# Patient Record
Sex: Male | Born: 1990 | Hispanic: Yes | Marital: Single | Smoking: Never smoker
Health system: Southern US, Community
[De-identification: ages and names within clinical notes are randomized; demographics above are authoritative.]

## PROBLEM LIST (undated history)

## (undated) DIAGNOSIS — F419 Anxiety disorder, unspecified: Secondary | ICD-10-CM

---

## 2016-06-18 ENCOUNTER — Inpatient Hospital Stay
Admission: RE | Admit: 2016-06-18 | Discharge: 2016-06-23 | DRG: 885 | Disposition: A | Discharge status: Home / Self Care | Payer: No Typology Code available for payment source | Source: Other Acute Inpatient Hospital | Attending: Psychiatry | Admitting: Psychiatry

## 2016-06-18 DIAGNOSIS — E669 Obesity, unspecified: Secondary | ICD-10-CM | POA: Diagnosis present

## 2016-06-18 DIAGNOSIS — F209 Schizophrenia, unspecified: Principal | ICD-10-CM

## 2016-06-18 DIAGNOSIS — I10 Essential (primary) hypertension: Secondary | ICD-10-CM | POA: Diagnosis present

## 2016-06-18 DIAGNOSIS — Z6824 Body mass index (BMI) 24.0-24.9, adult: Secondary | ICD-10-CM

## 2016-06-18 DIAGNOSIS — F17210 Nicotine dependence, cigarettes, uncomplicated: Secondary | ICD-10-CM | POA: Diagnosis present

## 2016-06-18 DIAGNOSIS — G4700 Insomnia, unspecified: Secondary | ICD-10-CM | POA: Diagnosis present

## 2016-06-18 DIAGNOSIS — F203 Undifferentiated schizophrenia: Secondary | ICD-10-CM | POA: Insufficient documentation

## 2016-06-18 DIAGNOSIS — F17200 Nicotine dependence, unspecified, uncomplicated: Secondary | ICD-10-CM

## 2016-06-18 DIAGNOSIS — F2081 Schizophreniform disorder: Secondary | ICD-10-CM | POA: Diagnosis not present

## 2016-06-18 DIAGNOSIS — F29 Unspecified psychosis not due to a substance or known physiological condition: Secondary | ICD-10-CM

## 2016-06-18 MED ORDER — DIPHENHYDRAMINE HCL 25 MG PO CAPS
50.0000 mg | ORAL_CAPSULE | Freq: Four times a day (QID) | ORAL | Status: DC | PRN
  Administered 2016-06-19: 50 mg via ORAL
  Filled 2016-06-18: qty 2

## 2016-06-18 MED ORDER — DIPHENHYDRAMINE HCL 50 MG/ML IJ SOLN: 50.0000 mg | Freq: Four times a day (QID) | INTRAMUSCULAR | Status: DC | PRN

## 2016-06-18 MED ORDER — TRAZODONE HCL 100 MG PO TABS: 100.0000 mg | ORAL_TABLET | Freq: Every evening | ORAL | Status: DC | PRN

## 2016-06-18 MED ORDER — ACETAMINOPHEN 325 MG PO TABS
650.0000 mg | ORAL_TABLET | Freq: Four times a day (QID) | ORAL | Status: DC | PRN
  Administered 2016-06-20: 650 mg via ORAL
  Filled 2016-06-18: qty 2

## 2016-06-18 MED ORDER — LORAZEPAM 2 MG PO TABS: 2.0000 mg | ORAL_TABLET | ORAL | Status: DC | PRN

## 2016-06-18 MED ORDER — HALOPERIDOL LACTATE 5 MG/ML IJ SOLN: 10.0000 mg | Freq: Three times a day (TID) | INTRAMUSCULAR | Status: DC | PRN

## 2016-06-18 MED ORDER — ALUM & MAG HYDROXIDE-SIMETH 200-200-20 MG/5ML PO SUSP: 30.0000 mL | ORAL | Status: DC | PRN

## 2016-06-18 MED ORDER — HALOPERIDOL 5 MG PO TABS
10.0000 mg | ORAL_TABLET | Freq: Three times a day (TID) | ORAL | Status: DC | PRN
Start: 2016-06-18 — End: 2016-06-23
  Administered 2016-06-18: 10 mg via ORAL
  Filled 2016-06-18: qty 2

## 2016-06-18 MED ORDER — HYDROXYZINE HCL 25 MG PO TABS: 25.0000 mg | ORAL_TABLET | Freq: Three times a day (TID) | ORAL | Status: DC | PRN

## 2016-06-18 MED ORDER — MAGNESIUM HYDROXIDE 400 MG/5ML PO SUSP: 30.0000 mL | Freq: Every day | ORAL | Status: DC | PRN

## 2016-06-18 MED ORDER — LORAZEPAM 2 MG/ML IJ SOLN: 2.0000 mg | INTRAMUSCULAR | Status: DC | PRN
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
## 2016-06-19 ENCOUNTER — Encounter: Payer: Self-pay | Admitting: Psychiatry

## 2016-06-19 ENCOUNTER — Inpatient Hospital Stay: Payer: No Typology Code available for payment source

## 2016-06-19 DIAGNOSIS — F17200 Nicotine dependence, unspecified, uncomplicated: Secondary | ICD-10-CM

## 2016-06-19 DIAGNOSIS — F2081 Schizophreniform disorder: Secondary | ICD-10-CM

## 2016-06-19 DIAGNOSIS — E669 Obesity, unspecified: Secondary | ICD-10-CM

## 2016-06-19 DIAGNOSIS — I10 Essential (primary) hypertension: Secondary | ICD-10-CM

## 2016-06-19 LAB — TSH: TSH: 3.103 u[IU]/mL (ref 0.350–4.500)

## 2016-06-19 LAB — LIPID PANEL
CHOLESTEROL: 170 mg/dL (ref 0–200)
HDL: 37 mg/dL — AB (ref >40)
LDL CALC: 89 mg/dL (ref 0–99)
TRIGLYCERIDES: 222 mg/dL — AB (ref <150)
Total CHOL/HDL Ratio: 4.6 RATIO
VLDL: 44 mg/dL — AB (ref 0–40)

## 2016-06-19 LAB — HEMOGLOBIN A1C: Hgb A1c MFr Bld: 5.3 % (ref 4.0–6.0)

## 2016-06-19 MED ORDER — HYDROCHLOROTHIAZIDE 12.5 MG PO CAPS
12.5000 mg | ORAL_CAPSULE | Freq: Every day | ORAL | Status: DC
  Administered 2016-06-19 – 2016-06-23 (×5): 12.5 mg via ORAL
  Filled 2016-06-19 (×5): qty 1

## 2016-06-19 MED ORDER — ARIPIPRAZOLE 5 MG PO TABS
15.0000 mg | ORAL_TABLET | Freq: Every day | ORAL | Status: DC
  Administered 2016-06-19 – 2016-06-20 (×2): 15 mg via ORAL
  Filled 2016-06-19 (×2): qty 1

## 2016-06-19 NOTE — [facility] Group Notes (Signed)
[facility]   06/19/2016 1 PM  ?  Participation Quality: Alert, Appropriate and Oriented   Mood/Affect: Depressed and Flat   Depression Rating: 0  Anxiety Rating: 0  Thoughts of Suicide: Pt denies SI/HI   Will you contract for safety? Yes   Current AVH: Pt denies   Plan for Discharge/Comments: Pt attended discharge planning group and actively participated in group. CSW provided pt with today's workbook. Pt shared he intends to spend time with family and with his grandmother upon discharge and that he plans to focus on his health.  Transportation Means: Pt reports access to transportation   Supports: No supports mentioned at this time     Dorothe PeaJonathan F. Toniya Rozar, MSW, LCSWA, LCAS

## 2016-06-19 NOTE — [facility] Suicide Risk Assessment (Signed)
[facility] INPATIENT:  Family/Significant Other Suicide Prevention Education  Suicide Prevention Education:  Education Completed; grandmother, Roderick PeeSabina Vasquez, ph #: [phone] has been identified by the patient as the family member/significant other with whom the patient will be residing, and identified as the person(s) who will aid the patient in the event of a mental health crisis (suicidal ideations/suicide attempt).  With written consent from the patient, the family member/significant other has been provided the following suicide prevention education, prior to the and/or following the discharge of the patient.  The suicide prevention education provided includes the following:  Suicide risk factors  Suicide prevention and interventions  National Suicide Hotline telephone number  [facility] assessment telephone number  [facility] Emergency Assistance 911  [facility] and/or Residential Mobile Crisis Unit telephone number  Request made of family/significant other to:  Remove weapons (e.g., guns, rifles, knives), all items previously/currently identified as safety concern.    Remove drugs/medications (over-the-counter, prescriptions, illicit drugs), all items previously/currently identified as a safety concern.  The family member/significant other verbalizes understanding of the suicide prevention education information provided.  The family member/significant other agrees to remove the items of safety concern listed above.  Lynden OxfordKadijah R Zachary Nole, MSW, LCSW-A 06/19/2016, 3:09 PM
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
## 2016-06-19 NOTE — [facility] Group Notes (Signed)
[facility] LCSW Group Therapy   06/19/2016 9:15 am   Type of Therapy: Group Therapy   Participation Level: Active   Participation Quality: Attentive, Sharing and Supportive   Affect: Appropriate   Cognitive: Alert and Oriented   Insight: Developing/Improving and Engaged   Engagement in Therapy: Developing/Improving and Engaged   Modes of Intervention: Clarification, Confrontation, Discussion, Education, Exploration, Limit-setting, Orientation, Problem-solving, Rapport Building, Dance movement psychotherapisteality Testing, Socialization and Support   Summary of Progress/Problems: The topic for group was balance in life. Today's group focused on defining balance in one's own words, identifying things that can knock one off balance, and exploring healthy ways to maintain balance in life. Group members were asked to provide an example of a time when they felt off balance, describe how they handled that situation, and process healthier ways to regain balance in the future. Group members were asked to share the most important tool for maintaining balance that they [facility] and how they plan to apply this method after discharge. Pt shared that the pt maintained balance in the pt's life by breathing exercises and enjoying the "outdoors".  Pt shared an instance in which the pt felt off balance and neglected to enjoy himself by hiking and working outside.  Pt shared the pt then began to display a negative attitude towards the pt's family Pt describe the pt's most valuable tool as remaining positive through going outside.  Pt was polite and cooperative with the CSW and other group members and focused and attentive to the topics discussed and the sharing of others.  Dorothe PeaJonathan F. Dameon Soltis, LCSWA, LCAS 06/19/16

## 2016-06-19 NOTE — Progress Notes (Signed)
D: Patient is alert and oriented to  person on the unit this shift. Patient attended   groups today. Patient denies suicidal ideation, homicidal ideation, auditory or visual hallucinations at the present time.  A: Scheduled medications are administered to patient as per MD orders. Emotional support and encouragement are provided. Patient is maintained on q.15 minute safety checks. Patient is informed to notify staff with questions or concerns. R: No adverse medication reactions are noted. Patient is cooperative with medication administration and treatment plan today. Patient is anxious ,isolative but cooperative on the unit at this time. Patient does not interact with others on the unit this shift. Patient contracts for safety at this time. Patient remains safe at this time. Anxiety 8/10, depression 6/10
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
## 2016-06-19 NOTE — Plan of Care (Signed)
Problem: Coping: Goal: Ability to verbalize frustrations and anger appropriately will improve Outcome: Not Progressing Patient is isolative guarded and unable to verbalize frustrations t this time  [place] RN
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
## 2016-06-19 NOTE — [facility] Suicide Risk Assessment (Addendum)
[facility]   Nursing information obtained from:    Demographic factors:    Current Mental Status:    Loss Factors:    Historical Factors:    Risk Reduction Factors:     Total Time spent with patient: 1 hour Principal Problem: Schizophreniform disorder (HCC) Diagnosis:   Patient Active Problem List   Diagnosis Date Noted  . Tobacco use disorder [F17.200] 06/19/2016  . 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 Essential hypertension [I10] 06/19/2016  . Obesity [E66.9] 06/19/2016   Subjective Data:   Continued Clinical Symptoms:  Alcohol Use Disorder Identification Test Final Score (AUDIT): 0 The "Alcohol Use Disorders Identification Test", Guidelines for Use in Primary Care, Second Edition.  World Science writerHealth Organization [facility](WHO). Score between 0-7:  no or low risk or alcohol related problems. Score between 8-15:  moderate risk of alcohol related problems. Score between 16-19:  high risk of alcohol related problems. Score 20 or above:  warrants further diagnostic evaluation for alcohol dependence and treatment.   CLINICAL FACTORS:   Severe Anxiety and/or Agitation Currently Psychotic   Psychiatric Specialty Exam: Physical Exam  Constitutional: He is oriented to person, place, and time. He appears well-developed and well-nourished.  HENT:  Head: Normocephalic and atraumatic.  Eyes: EOM are normal.  Neck: Normal range of motion.  Respiratory: Effort normal.  Musculoskeletal: Normal range of motion.  Neurological: He is alert and oriented to person, place, and time.    Review of Systems  Constitutional: Negative.   HENT: Negative.   Eyes: Negative.   Respiratory: Negative.   Cardiovascular: Negative.   Gastrointestinal: Negative.   Genitourinary: Negative.   Musculoskeletal: Negative.   Skin: Negative.   Neurological: Negative.   Endo/Heme/Allergies: Negative.   Psychiatric/Behavioral: Negative.     Blood pressure 133/90, pulse  70, temperature 98.1 F (36.7 C), temperature source Oral, resp. rate 16, height 5\' 9"  (1.753 m), weight 74.844 kg (165 lb), SpO2 100 %.Body mass index is 24.36 kg/(m^2).                                                    Sleep:  Number of Hours: 8      COGNITIVE FEATURES THAT CONTRIBUTE TO RISK:  Closed-mindedness    SUICIDE RISK:   Mild:  Suicidal ideation of limited frequency, intensity, duration, and specificity.  There are no identifiable plans, no associated intent, mild dysphoria and related symptoms, good self-control (both objective and subjective assessment), few other risk factors, and identifiable protective factors, including available and accessible social support.  [facility]    I certify that inpatient services furnished can reasonably be expected to improve the patient's condition.   Jimmy FootmanHernandez-Gonzalez,  Shellee Streng, MD 06/19/2016, 11:56 AM

## 2016-06-20 ENCOUNTER — Encounter: Payer: Self-pay | Admitting: Psychiatry

## 2016-06-20 DIAGNOSIS — F209 Schizophrenia, unspecified: Secondary | ICD-10-CM

## 2016-06-20 LAB — RAPID HIV SCREEN (HIV 1/2 AB+AG)
HIV 1/2 Antibodies: NONREACTIVE
HIV-1 P24 ANTIGEN - HIV24: NONREACTIVE

## 2016-06-20 LAB — PROLACTIN: Prolactin: 33.8 ng/mL — ABNORMAL HIGH (ref 4.0–15.2)

## 2016-06-20 LAB — VITAMIN B12: VITAMIN B 12: 391 pg/mL (ref 180–914)

## 2016-06-20 MED ORDER — ARIPIPRAZOLE ER 400 MG IM SUSR
400.0000 mg | INTRAMUSCULAR | Status: DC
  Administered 2016-06-22: 400 mg via INTRAMUSCULAR
  Filled 2016-06-20: qty 400

## 2016-06-20 MED ORDER — ARIPIPRAZOLE 10 MG PO TABS
20.0000 mg | ORAL_TABLET | Freq: Every day | ORAL | Status: DC
  Administered 2016-06-21 – 2016-06-23 (×3): 20 mg via ORAL
  Filled 2016-06-20 (×3): qty 2

## 2016-06-20 MED ORDER — DIPHENHYDRAMINE HCL 25 MG PO CAPS
50.0000 mg | ORAL_CAPSULE | Freq: Every day | ORAL | Status: DC
Start: 2016-06-20 — End: 2016-06-23
  Administered 2016-06-20 – 2016-06-22 (×3): 50 mg via ORAL
  Filled 2016-06-20 (×3): qty 2
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
## 2016-06-20 NOTE — [facility] Group Notes (Signed)
[facility] Group Notes:  (Nursing/MHT/Case Management/Adjunct)  Date:  06/20/2016  Time:  4:03 PM  Type of Therapy:  Psychoeducational Skills  Participation Level:  Active  Participation Quality:  Appropriate, Attentive and Supportive  Affect:  Appropriate  Cognitive:  Appropriate  Insight:  Appropriate  Engagement in Group:  Supportive  Modes of Intervention:  Discussion and Education  Summary of Progress/Problems:  Tom [name] Tom [name] 06/20/2016, 4:03 PM
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
## 2016-06-20 NOTE — Progress Notes (Signed)
D: Patient appears somewhat flat. He has remained isolative to room. Only complaints pt has is stiff neck. Denies SI/HI/AVH. Also denies pain. States he thinks the stiff neck is from medication.  A: Medication given with medication. Encouragement provided. PRN Benadryl given.  R: Patient was compliant with medication. He has remained calm and cooperative. Safety maintained with 15 min checks.

## 2016-06-20 NOTE — Plan of Care (Signed)
Problem: Education: Goal: Mental status will improve Outcome: Not Progressing Patient remains delusional and suspicious.

## 2016-06-20 NOTE — Tx Team (Signed)
Interdisciplinary Treatment Plan Update (Adult)         Date: 06/20/2016   Time Reviewed: 10:30 AM   Progress in Treatment: Improving Attending groups: Yes  Participating in groups: Yes  Taking medication as prescribed: Yes  Tolerating medication: Yes  Family/Significant other contact made: Yes, CSW has spoken with grandmother, Collene Mares  Patient understands diagnosis: Yes  Discussing patient identified problems/goals with staff: Yes  Medical problems stabilized or resolved: Yes  Denies suicidal/homicidal ideation: Yes  Issues/concerns per patient self-inventory: Yes  Other:   New problem(s) identified: N/A   Discharge Plan or Barriers: see below    Reason for Continuation of Hospitalization:   Depression   Anxiety   Medication Stabilization   Comments: N/A   Estimated discharge date: 06/23/16    Patient is a 25year old male admitted for bizarre behaviors and delusional thinking. Patient lives in [place], [place]. Patient will benefit from crisis stabilization, medication evaluation, group therapy, and psycho education in addition to case management for discharge planning. Patient and CSW reviewed pt's identified goals and treatment plan. Pt verbalized understanding and agreed to treatment plan.    Review of initial/current patient goals per problem list:  1. Goal(s): Patient will participate in aftercare plan   Met: Yes  Target date: 3-5 days post admission date   As evidenced by: Patient will participate within aftercare plan AEB aftercare provider and housing plan at discharge being identified.   06/20/16: Patient will follow-up with [facility] in [place]   2. Goal (s): Patient will exhibit decreased depressive symptoms and suicidal ideations.   Met: Goal progressing   Target date: 3-5 days post admission date   As evidenced by: Patient will utilize self-rating of depression at 3 or below and demonstrate decreased signs of depression or be deemed stable for discharge  by MD.  06/20/16: pt denies SI/HI at this time. Rates depression score at a 3 at this time.    3. Goal(s): Patient will demonstrate decreased signs of psychosis  * Met: Goal progressing * Target date: 3-5 days post admission date  * As evidenced by: Patient will demonstrate decreased frequency of AVH or return to baseline function  06/20/16: Patient denies AVH at this time. Dr. Lemmie Evens is still evaluating and adjusting medications.   4. Goal (s): Patient will demonstrate decreased signs of mania  * Met: Goal progressing * Target date: 3-5 days post admission date  * As evidenced by: Patient demonstrate decreased signs of mania AEB decreased mood instability and demonstration of stable mood  06/20/16: MD is still evaluating and making sure pt is stabilized before d/c.   Attendees:  Patient: Tom [name] Family:  Physician: Dr. Hildred Priest, MD   06/20/2016 10:30 AM  Nursing: Carolynn Sayers , RN     06/20/2016 10:30 AM  Clinical Social Worker: Emilie Rutter, LCSWA  06/20/2016 10:30 AM

## 2016-06-20 NOTE — Plan of Care (Signed)
Problem: Education: Goal: Knowledge of the prescribed therapeutic regimen will improve Outcome: Progressing Patient is knowledgeable about medication.

## 2016-06-20 NOTE — [facility] Group Notes (Signed)
[facility] Group Notes:  (Nursing/MHT/Case Management/Adjunct)  Date:  06/20/2016  Time:  2:33 AM  Type of Therapy:  Psychoeducational Skills  Participation Level:  Active  Participation Quality:  Appropriate, Attentive, Sharing and Supportive  Affect:  Appropriate  Cognitive:  Alert, Appropriate and Oriented  Insight:  Good  Engagement in Group:  Engaged and Supportive  Modes of Intervention:  Discussion and Exploration  Summary of Progress/Problems:  Foy GuadalajaraJasmine R Gargi Berch 06/20/2016, 2:33 AM

## 2016-06-21 DIAGNOSIS — F203 Undifferentiated schizophrenia: Secondary | ICD-10-CM

## 2016-06-21 LAB — RPR: RPR: NONREACTIVE

## 2016-06-21 NOTE — Progress Notes (Signed)
Pt has been pleasant and cooperative. Pt denies SI and A/V hallucinations although pt does appear to be responding to internal stimuli or have some thought blocking . Pt is able to contract for safety. Pt's mood and affect appears depressed.Pt has attended all unit activities. Will continue to observe and maintain a safe environment.

## 2016-06-21 NOTE — Progress Notes (Addendum)
[facility] MD Progress Note  06/21/2016 5:53 AM Tom [name]  MRN:  [id number]  Subjective:  Tom [name] does not report any symptoms of depression, anxiety, or psychosis. There is some psychomotor retardation. He is very quiet. He is secluded to his room. He accepts medications and tolerates them well. There are no somatic complaints. He will receive Abilify injection tomorrow.  Principal Problem: Schizophrenia (HCC) Diagnosis:   Patient Active Problem List   Diagnosis Date Noted  . Schizophrenia (HCC) [F20.9] 06/20/2016  . Tobacco use disorder [F17.200] 06/19/2016  . Essential hypertension [I10] 06/19/2016  . Obesity [E66.9] 06/19/2016   Total Time spent with patient: 20 minutes  Past Psychiatric History: Psychosis.  Past Medical History: History reviewed. No pertinent past medical history. History reviewed. No pertinent past surgical history. Family History: History reviewed. No pertinent family history. Family Psychiatric  History: See H&P. Social History:  History  Alcohol Use No     History  Drug Use No    Social History   Social History  . Marital Status: Single    Spouse Name: N/A  . Number of Children: N/A  . Years of Education: N/A   Social History Main Topics  . Smoking status: Never Smoker   . Smokeless tobacco: None  . Alcohol Use: No  . Drug Use: No  . Sexual Activity: No   Other Topics Concern  . None   Social History Narrative   Additional Social History:                         Sleep: Fair  Appetite:  Fair  Current Medications: Current Facility-Administered Medications  Medication Dose Route Frequency Provider Last Rate Last Dose  . acetaminophen (TYLENOL) tablet 650 mg  650 mg Oral Q6H PRN Jimmy Footman, MD   650 mg at 06/20/16 0810  . alum & mag hydroxide-simeth (MAALOX/MYLANTA) 200-200-20 MG/5ML suspension 30 mL  30 mL Oral Q4H PRN Jimmy Footman, MD      . ARIPiprazole (ABILIFY) tablet 20 mg  20 mg Oral  Daily Jimmy Footman, MD      . Melene Muller ON 06/22/2016] ARIPiprazole ER SUSR 400 mg  400 mg Intramuscular Q30 days Jimmy Footman, MD      . diphenhydrAMINE (BENADRYL) capsule 50 mg  50 mg Oral Q6H PRN Jimmy Footman, MD   50 mg at 06/19/16 2057   Or  . diphenhydrAMINE (BENADRYL) injection 50 mg  50 mg Intramuscular Q6H PRN Jimmy Footman, MD      . diphenhydrAMINE (BENADRYL) capsule 50 mg  50 mg Oral QHS Jimmy Footman, MD   50 mg at 06/20/16 2120  . haloperidol (HALDOL) tablet 10 mg  10 mg Oral Q8H PRN Jimmy Footman, MD   10 mg at 06/18/16 2148   Or  . haloperidol lactate (HALDOL) injection 10 mg  10 mg Intramuscular Q8H PRN Jimmy Footman, MD      . hydrochlorothiazide (MICROZIDE) capsule 12.5 mg  12.5 mg Oral Daily Jimmy Footman, MD   12.5 mg at 06/20/16 0809  . hydrOXYzine (ATARAX/VISTARIL) tablet 25 mg  25 mg Oral TID PRN Jimmy Footman, MD      . LORazepam (ATIVAN) tablet 2 mg  2 mg Oral Q2H PRN Jimmy Footman, MD       Or  . LORazepam (ATIVAN) injection 2 mg  2 mg Intramuscular Q2H PRN Jimmy Footman, MD      . magnesium hydroxide (MILK OF MAGNESIA) suspension 30 mL  30 mL Oral  Daily PRN Jimmy Footman, MD      . traZODone (DESYREL) tablet 100 mg  100 mg Oral QHS PRN Jimmy Footman, MD        Lab Results:  Results for orders placed or performed during the hospital encounter of 06/18/16 (from the past 48 hour(s))  Hemoglobin A1c     Status: None   Collection Time: 06/19/16  6:47 AM  Result Value Ref Range   Hgb A1c MFr Bld 5.3 4.0 - 6.0 %  Lipid panel, fasting     Status: Abnormal   Collection Time: 06/19/16  6:47 AM  Result Value Ref Range   Cholesterol 170 0 - 200 mg/dL   Triglycerides 161 (H) <150 mg/dL   HDL 37 (L) >09 mg/dL   Total CHOL/HDL Ratio 4.6 RATIO   VLDL 44 (H) 0 - 40 mg/dL   LDL Cholesterol 89 0 - 99 mg/dL    Comment:         Total Cholesterol/HDL:CHD Risk Coronary Heart Disease Risk Table                     Men   Women  1/2 Average Risk   3.4   3.3  Average Risk       5.0   4.4  2 X Average Risk   9.6   7.1  3 X Average Risk  23.4   11.0        Use the calculated Patient Ratio above and the CHD Risk Table to determine the patient's CHD Risk.        ATP III CLASSIFICATION (LDL):  <100     mg/dL   Optimal  604-540  mg/dL   Near or Above                    Optimal  130-159  mg/dL   Borderline  981-191  mg/dL   High  >478     mg/dL   Very High   TSH     Status: None   Collection Time: 06/19/16  6:47 AM  Result Value Ref Range   TSH 3.103 0.350 - 4.500 uIU/mL  Prolactin     Status: Abnormal   Collection Time: 06/19/16  6:47 AM  Result Value Ref Range   Prolactin 33.8 (H) 4.0 - 15.2 ng/mL    Comment: (NOTE) Performed At: [facility] [address] [place], [place] [id number] Mila Homer MD MV:[id number]   Rapid HIV screen (HIV 1/2 Ab+Ag)     Status: None   Collection Time: 06/20/16  6:20 AM  Result Value Ref Range   HIV-1 P24 Antigen - HIV24 NON REACTIVE NON REACTIVE   HIV 1/2 Antibodies NON REACTIVE NON REACTIVE   Interpretation (HIV Ag Ab)      A non reactive test result means that HIV 1 or HIV 2 antibodies and HIV 1 p24 antigen were not detected in the specimen.  RPR     Status: None   Collection Time: 06/20/16  6:20 AM  Result Value Ref Range   RPR Ser Ql Non Reactive Non Reactive    Comment: (NOTE) Performed At: [facility] [address] [place], [place] [id number] Mila Homer MD UU:[id number]   Vitamin B12     Status: None   Collection Time: 06/20/16  6:20 AM  Result Value Ref Range   Vitamin B-12 391 180 - 914 pg/mL    Comment: (NOTE) This assay is not validated for testing  neonatal or myeloproliferative syndrome specimens for Vitamin B12 levels. Performed at [facility]     Blood Alcohol level:  No results found for: [facility]  Metabolic  Disorder Labs: Lab Results  Component Value Date   HGBA1C 5.3 06/19/2016   Lab Results  Component Value Date   PROLACTIN 33.8* 06/19/2016   Lab Results  Component Value Date   CHOL 170 06/19/2016   TRIG 222* 06/19/2016   HDL 37* 06/19/2016   CHOLHDL 4.6 06/19/2016   VLDL 44* 06/19/2016   LDLCALC 89 06/19/2016    Physical Findings: AIMS: Facial and Oral Movements Muscles of Facial Expression: None, normal Lips and Perioral Area: None, normal Jaw: None, normal Tongue: None, normal,Extremity Movements Upper (arms, wrists, hands, fingers): None, normal Lower (legs, knees, ankles, toes): None, normal, Trunk Movements Neck, shoulders, hips: None, normal, Overall Severity Severity of abnormal movements (highest score from questions above): None, normal Incapacitation due to abnormal movements: None, normal Patient's awareness of abnormal movements (rate only patient's report): No Awareness, Dental Status Current problems with teeth and/or dentures?: No Does patient usually wear dentures?: No  CIWA:    COWS:     Musculoskeletal: Strength & Muscle Tone: within normal limits Gait & Station: normal Patient leans: N/A  Psychiatric Specialty Exam: Physical Exam  Nursing note and vitals reviewed.   Review of Systems  Psychiatric/Behavioral: Positive for hallucinations.  All other systems reviewed and are negative.   Blood pressure 136/89, pulse 96, temperature 98.9 F (37.2 C), temperature source Oral, resp. rate 18, height 5\' 9"  (1.753 m), weight 74.844 kg (165 lb), SpO2 100 %.Body mass index is 24.36 kg/(m^2).  General Appearance: Fairly Groomed  Eye Contact:  Good  Speech:  Clear and Coherent  Volume:  Normal  Mood:  Anxious  Affect:  Appropriate  Thought Process:  Disorganized  Orientation:  Full (Time, Place, and Person)  Thought Content:  Delusions and Paranoid Ideation  Suicidal Thoughts:  No  Homicidal Thoughts:  No  Memory:  Immediate;   Fair Recent;    Fair Remote;   Fair  Judgement:  Poor  Insight:  Lacking  Psychomotor Activity:  Psychomotor Retardation  Concentration:  Concentration: Fair and Attention Span: Fair  Recall:  [employer]Fair  Fund of Knowledge:  Fair  Language:  Fair  Akathisia:  No  Handed:  Right  AIMS (if indicated):     Assets:  Communication Skills Desire for Improvement Housing Physical Health Resilience Social Support  ADL's:  Intact  Cognition:  WNL  Sleep:  Number of Hours: 7.75     Treatment Plan Summary: Daily contact with patient to assess and evaluate symptoms and progress in treatment and Medication management   The patient is a 25 year old Hispanic male who presents with aggression and multiple different delusions. Patient has multiple grandiose delusions and some paranoid ideation towards his stepfather. Patient denies hallucinations. There is no evidence of significant negative symptoms. He was calm, pleasant and cooperative today.  Spoke with mother who says pt has been gradually worsening since age 25. Pt at times talks and laugh to himself. He has episodes of uncontrollable anger. Delusional and at times incoherent.  He has been started on Abilify. Today I will increase dose to 20 mg. Abilify IM will be given on Sunday.  EPS?: c/o stiff neck. Possible dystonia. Will start benadryl 50 mg qhs  For insomnia I'll order trazodone 100 mg by mouth daily at bedtime when necessary  For agitation he has been started on Haldol, Ativan  and Benadryl every 8 hours as needed. There is also orders for Ativan when necessary every 2 hours.  Tobacco use disorder: Patient declines from receiving a nicotine patch  Hypertension the patient has been started on HCTZ. We will change diet to low sodium  Diet low-sodium  Precautions every 15 minute checks  Hospitalization and status continue involuntary commitment  Labs: lipid panel, hemoglobin A1c, prolactin, TSH, HIV, RPR ---all wnl  Imaging head CT:    Disposition once stable the patient will be discharged back to his grandparents house  Follow up social worker will help [place] set up follow-up in his community.  Will obtain collateral information from his grandparents telephone number is [phone] Rozell Searing  7/8 no medication adjustments were made.  Kristine Linea, MD 06/21/2016, 5:53 AM

## 2016-06-21 NOTE — [facility] Group Notes (Signed)
[facility] Group Notes:  (Nursing/MHT/Case Management/Adjunct)  Date:  06/21/2016  Time:  12:56 AM  Type of Therapy:  Evening Wrap-up Group  Participation Level:  Active  Participation Quality:  Active  Affect:  Appropriate  Cognitive:  Alert and Appropriate  Insight:  Appropriate and Good  Engagement in Group:  Developing/Improving and Engaged  Modes of Intervention:  Discussion  Summary of Progress/Problems:  Tom MorrowChelsea [name] Tom [name] 06/21/2016, 12:56 AM

## 2016-06-21 NOTE — [facility] Group Notes (Signed)
  06/21/2016 2:32 PM  Type of Therapy:  Group Therapy  Participation Level:  Minimal   Participation Quality:  Attentive  Affect:  Appropriate  Cognitive:  Alert  Insight:  Limited  Engagement in Therapy:  Limited  Modes of Intervention:  Discussion, Education, Socialization and Support  Summary of Progress/Problems: Balance in life: Patients will discuss the concept of balance and how it looks and feels to be unbalanced. Pt will identify areas in their life that is unbalanced and ways to become more balanced. Pt attended group and stayed the entire time. Pt sat quietly and listened to other group member share.   Anamae Rochelle L Milanie Rosenfield MSW, LCSWA  06/21/2016, 2:32 PM   

## 2016-06-22 DIAGNOSIS — F203 Undifferentiated schizophrenia: Secondary | ICD-10-CM | POA: Insufficient documentation

## 2016-06-22 NOTE — [facility] Group Notes (Signed)
[facility] Group Notes:  (Nursing/MHT/Case Management/Adjunct)  Date:  06/22/2016  Time:  9:58 PM  Type of Therapy:  Group Therapy  Participation Level:  Active  Participation Quality:  Appropriate  Affect:  Appropriate  Cognitive:  Appropriate  Insight:  Appropriate  Engagement in Group:  Engaged  Modes of Intervention:  Discussion  Summary of Progress/Problems:  Tom [name] 06/22/2016, 9:58 PM

## 2016-06-22 NOTE — Progress Notes (Signed)
Denies any SI/HI/AVH. Forwards little. Cooperative and pleasant. Medication compliant. Appropriate interaction with staff and peers. Voices no additional concerns. Safety maintained.

## 2016-06-22 NOTE — Progress Notes (Signed)
[facility]  06/22/2016 6:50 AM Tom [name]  MRN:  [id number]  Subjective:  Tom [name] reports much improvement. His thinking is clear and paranoia has resolved. He now believes that he benefited from staying in the hospital. He has been taking his medications as prescribed including Abilify injection this morning. He denies any stiffness from medications. His mood is good affect bright. He however stays in his room quite a bit. There are no somatic complaints. Sleep and appetite are good.  Principal Problem: Schizophrenia (HCC) Diagnosis:   Patient Active Problem List   Diagnosis Date Noted  . Schizophrenia (HCC) [F20.9] 06/20/2016  . Tobacco use disorder [F17.200] 06/19/2016  . Essential hypertension [I10] 06/19/2016  . Obesity [E66.9] 06/19/2016   Total Time spent with patient: 20 minutes  Past Psychiatric History: Schizophrenia.  Past Medical History: History reviewed. No pertinent past medical history. History reviewed. No pertinent past surgical history. Family History: History reviewed. No pertinent family history. Family Psychiatric  History: [name] H&P. Social History:  History  Alcohol Use No     History  Drug Use No    Social History   Social History  . Marital Status: Single    Spouse Name: N/A  . Number of Children: N/A  . Years of Education: N/A   Social History Main Topics  . Smoking status: Never Smoker   . Smokeless tobacco: None  . Alcohol Use: No  . Drug Use: No  . Sexual Activity: No   Other Topics Concern  . None   Social History Narrative   Additional Social History:                         Sleep: Fair  Appetite:  Fair  Current Medications: Current Facility-Administered Medications  Medication Dose Route Frequency Provider Last Rate Last Dose  . acetaminophen (TYLENOL) tablet 650 mg  650 mg Oral Q6H PRN Jimmy FootmanAndrea Hernandez-Gonzalez, MD   650 mg at 06/20/16 0810  . alum & mag hydroxide-simeth (MAALOX/MYLANTA)  200-200-20 MG/5ML suspension 30 mL  30 mL Oral Q4H PRN Jimmy FootmanAndrea Hernandez-Gonzalez, MD      . ARIPiprazole (ABILIFY) tablet 20 mg  20 mg Oral Daily Jimmy FootmanAndrea Hernandez-Gonzalez, MD   20 mg at 06/21/16 0903  . ARIPiprazole ER SUSR 400 mg  400 mg Intramuscular Q30 days Jimmy FootmanAndrea Hernandez-Gonzalez, MD   400 mg at 06/22/16 0647  . diphenhydrAMINE (BENADRYL) capsule 50 mg  50 mg Oral Q6H PRN Jimmy FootmanAndrea Hernandez-Gonzalez, MD   50 mg at 06/19/16 2057   Or  . diphenhydrAMINE (BENADRYL) injection 50 mg  50 mg Intramuscular Q6H PRN Jimmy FootmanAndrea Hernandez-Gonzalez, MD      . diphenhydrAMINE (BENADRYL) capsule 50 mg  50 mg Oral QHS Jimmy FootmanAndrea Hernandez-Gonzalez, MD   50 mg at 06/21/16 2123  . haloperidol (HALDOL) tablet 10 mg  10 mg Oral Q8H PRN Jimmy FootmanAndrea Hernandez-Gonzalez, MD   10 mg at 06/18/16 2148   Or  . haloperidol lactate (HALDOL) injection 10 mg  10 mg Intramuscular Q8H PRN Jimmy FootmanAndrea Hernandez-Gonzalez, MD      . hydrochlorothiazide (MICROZIDE) capsule 12.5 mg  12.5 mg Oral Daily Jimmy FootmanAndrea Hernandez-Gonzalez, MD   12.5 mg at 06/21/16 0903  . hydrOXYzine (ATARAX/VISTARIL) tablet 25 mg  25 mg Oral TID PRN Jimmy FootmanAndrea Hernandez-Gonzalez, MD      . LORazepam (ATIVAN) tablet 2 mg  2 mg Oral Q2H PRN Jimmy FootmanAndrea Hernandez-Gonzalez, MD       Or  . LORazepam (ATIVAN) injection 2 mg  2 mg Intramuscular Q2H PRN Jimmy Footman, MD      . magnesium hydroxide (MILK OF MAGNESIA) suspension 30 mL  30 mL Oral Daily PRN Jimmy Footman, MD      . traZODone (DESYREL) tablet 100 mg  100 mg Oral QHS PRN Jimmy Footman, MD        Lab Results: No results found for this or any previous visit (from the past 48 hour(s)).  Blood Alcohol level:  No results found for: [facility]  Metabolic Disorder Labs: Lab Results  Component Value Date   HGBA1C 5.3 06/19/2016   Lab Results  Component Value Date   PROLACTIN 33.8* 06/19/2016   Lab Results  Component Value Date   CHOL 170 06/19/2016   TRIG 222* 06/19/2016   HDL 37* 06/19/2016    CHOLHDL 4.6 06/19/2016   VLDL 44* 06/19/2016   LDLCALC 89 06/19/2016    Physical Findings: AIMS: Facial and Oral Movements Muscles of Facial Expression: None, normal Lips and Perioral Area: None, normal Jaw: None, normal Tongue: None, normal,Extremity Movements Upper (arms, wrists, hands, fingers): None, normal Lower (legs, knees, ankles, toes): None, normal, Trunk Movements Neck, shoulders, hips: None, normal, Overall Severity Severity of abnormal movements (highest score from questions above): None, normal Incapacitation due to abnormal movements: None, normal Patient's awareness of abnormal movements (rate only patient's report): No Awareness, Dental Status Current problems with teeth and/or dentures?: No Does patient usually wear dentures?: No  CIWA:    COWS:     Musculoskeletal: Strength & Muscle Tone: within normal limits Gait & Station: normal Patient leans: N/A  Psychiatric Specialty Exam: Physical Exam  Nursing note and vitals reviewed.   Review of Systems  Psychiatric/Behavioral: Positive for hallucinations.  All other systems reviewed and are negative.   Blood pressure 133/87, pulse 88, temperature 98.5 F (36.9 C), temperature source Oral, resp. rate 20, height  (1.753 m), weight 74.844 kg (165 lb), SpO2 100 %.Body mass index is 24.36 kg/(m^2).  General Appearance: Casual  Eye Contact:  Good  Speech:  Clear and Coherent  Volume:  Normal  Mood:  Euthymic  Affect:  Appropriate  Thought Process:  Goal Directed  Orientation:  Full (Time, Place, and Person)  Thought Content:  Delusions and Paranoid Ideation  Suicidal Thoughts:  No  Homicidal Thoughts:  No  Memory:  Immediate;   Fair Recent;   Fair Remote;   Fair  Judgement:  Impaired  Insight:  Shallow  Psychomotor Activity:  Normal  Concentration:  Concentration: Fair and Attention Span: Fair  Recall:  [employer] of Knowledge:  Fair  Language:  Fair  Akathisia:  No  Handed:  Right  AIMS (if  indicated):     Assets:  Communication Skills Desire for Improvement Housing Physical Health Resilience Social Support  ADL's:  Intact  Cognition:  WNL  Sleep:  Number of Hours: 8.3     Treatment Plan Summary: Daily contact with patient to assess and evaluate symptoms and progress in treatment and Medication management   The patient is a 25 year old Hispanic male who presents with aggression and multiple different delusions. Patient has multiple grandiose delusions and some paranoid ideation towards his stepfather. Patient denies hallucinations. There is no evidence of significant negative symptoms. He was calm, pleasant and cooperative today.  Spoke with mother who says pt has been gradually worsening since age 54. Pt at times talks and laugh to himself. He has episodes of uncontrollable anger. Delusional and at times incoherent.  He has been  started on Abilify. Today I will increase dose to 20 mg. Abilify IM will be given on Sunday.  EPS?: c/o stiff neck. Possible dystonia. Will start benadryl 50 mg qhs  For insomnia I'll order trazodone 100 mg by mouth daily at bedtime when necessary  For agitation he has been started on Haldol, Ativan and Benadryl every 8 hours as needed. There is also orders for Ativan when necessary every 2 hours.  Tobacco use disorder: Patient declines from receiving a nicotine patch  Hypertension the patient has been started on HCTZ. We will change diet to low sodium  Diet low-sodium  Precautions every 15 minute checks  Hospitalization and status continue involuntary commitment  Labs: lipid panel, hemoglobin A1c, prolactin, TSH, HIV, RPR ---all wnl  Imaging head CT:   Disposition once stable the patient will be discharged back to his grandparents house  Follow up social worker will help [place] set up follow-up in his community.  Will obtain collateral information from his grandparents telephone number is [phone] Rozell Searing  7/9 no  medication adjustments were offered.  Kristine Linea, MD 06/22/2016, 6:50 AM

## 2016-06-22 NOTE — Progress Notes (Signed)
Pt has been pleasant and cooperative. Pt denies SI and A/V hallucinations. Pt is able to contract for safety. Pt's mood and affect appears depressed.Pt has attended all unit activities. Will continue to observe and maintain a safe environment.

## 2016-06-22 NOTE — [facility] Group Notes (Signed)
[facility] Group Notes:  (Nursing/MHT/Case Management/Adjunct)  Date:  06/22/2016  Time:  2:25 AM  Type of Therapy:  Psychoeducational Skills  Participation Level:  Active  Participation Quality:  Appropriate  Affect:  Appropriate  Cognitive:  Appropriate  Insight:  Appropriate and Good  Engagement in Group:  Engaged  Modes of Intervention:  Discussion, Socialization and Support  Summary of Progress/Problems:  Tom MilroyLaquanda Y Koriana [name] 06/22/2016, 2:25 AM

## 2016-06-22 NOTE — [facility] Group Notes (Signed)
[facility] LCSW Group Therapy  06/22/2016 2:33 PM  Type of Therapy:  Group Therapy  Participation Level:  Active  Participation Quality:  Attentive  Affect:  Appropriate  Cognitive:  Alert  Insight:  Limited  Engagement in Therapy:  Limited  Modes of Intervention:  Discussion, Education, Socialization and Support  Summary of Progress/Problems: Pt will identify unhealthy thoughts and how they impact their emotions and behavior. Pt will be encouraged to discuss these thoughts, emotions and behaviors with the group. Donald PoreJorge attended group and stayed the entire time. He attempted to participate but had difficulties discussing current topic.    Daisy Floroandace L Gilberta Peeters MSW, LCSWA  06/22/2016, 2:33 PM

## 2016-06-23 MED ORDER — ARIPIPRAZOLE 20 MG PO TABS: 20.0000 mg | ORAL_TABLET | Freq: Every day | ORAL | Status: AC

## 2016-06-23 MED ORDER — DIPHENHYDRAMINE HCL 50 MG PO CAPS: 50.0000 mg | ORAL_CAPSULE | Freq: Every day | ORAL | Status: AC

## 2016-06-23 MED ORDER — HYDROCHLOROTHIAZIDE 12.5 MG PO CAPS: 12.5000 mg | ORAL_CAPSULE | Freq: Every day | ORAL | Status: AC

## 2016-06-23 MED ORDER — ARIPIPRAZOLE ER 400 MG IM SUSR: 400.0000 mg | INTRAMUSCULAR | Status: AC

## 2016-06-23 NOTE — Progress Notes (Addendum)
  [facility] :  Will you be returning to the same living situation after discharge:  Yes,  home with grandmother At discharge, do you have transportation home?: Yes,  sister will pick pt up Do you have the ability to pay for your medications: Yes,  [facility]  Release of information consent forms completed and in the chart;  Patient's signature needed at discharge.  Patient to Follow up at: Follow-up Information    [facility] [place]   Why:  Please arrive for your appointment on Tuesday July 11th at 12:15pm for your assessment for medication management and therapy   Contact information:   [facility] [place] [address],  [place], [place] [address] Phone: [phone] Fax: [phone]      Next level of care provider [facility] Link:no  Safety Planning and Suicide Prevention discussed: Yes,  SPE reviewed with pt and grandmother  Have you used any form of tobacco in the last 30 days? (Cigarettes, Smokeless Tobacco, Cigars, and/or Pipes): No  Has patient been referred to the Quitline?: Yes, faxed Quitline information.  Patient has been referred for addiction treatment: N/A  Lynden OxfordKadijah R Liona Wengert, LCSW-A 06/23/2016, 10:15 AM
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
## 2016-06-23 NOTE — Progress Notes (Signed)
Pt attended evening group. Denies SI/HI/AVH. Affect flat. Pleasant during interaction. Forwards little. Denies pain and voices no additional concerns at this time. Safety maintained.

## 2016-06-23 NOTE — Progress Notes (Signed)
Patient discharged home. DC instructions provided and explained. Medications reviewed. Rx given. All questions answered. Pt stable at discharge, denies SI, HI, AVH. Belongings returned.

## 2016-06-23 NOTE — [facility] Suicide Risk Assessment (Signed)
[facility]   Principal Problem: Schizophrenia [facility](HCC) Discharge Diagnoses:  Patient Active Problem List   Diagnosis Date Noted  . Undifferentiated schizophrenia (HCC) [F20.3]   . Schizophrenia (HCC) [F20.9] 06/20/2016  . Tobacco use disorder [F17.200] 06/19/2016  . Essential hypertension [I10] 06/19/2016  . Obesity [E66.9] 06/19/2016      Psychiatric Specialty Exam: ROS  Blood pressure 128/88, pulse 94, temperature 98.7 F (37.1 C), temperature source Oral, resp. rate 20, height 5\' 9"  (1.753 m), weight 74.844 kg (165 lb), SpO2 100 %.Body mass index is 24.36 kg/(m^2).                                                       Mental Status Per Nursing Assessment::   On Admission:     Demographic Factors:  Male, Adolescent or young adult and Unemployed  Loss Factors: Decrease in vocational status  Historical Factors: Impulsivity  Risk Reduction Factors:   Sense of responsibility to family and Positive social support  Continued Clinical Symptoms:  Schizophrenia:   Less than 25 years old  Cognitive Features That Contribute To Risk:  None    Suicide Risk:  Minimal: No identifiable suicidal ideation.  Patients presenting with no risk factors but with morbid ruminations; may be classified as minimal risk based on the severity of the depressive symptoms  Follow-up Information    [facility]   Why:  Please arrive for your appointment on Tuesday July 11th at 12:15pm for your assessment for medication management and therapy   Contact information:   [facility] [address],  [place], [place] [address] Phone: [phone] Fax: [phone]      Jimmy FootmanHernandez-Gonzalez,  Tramar Brueckner, MD 06/23/2016, 8:48 AM

## 2016-06-23 NOTE — Tx Team (Signed)
Interdisciplinary Treatment Plan Update (Adult)         Date: 06/23/2016   Time Reviewed: 10:30 AM   Progress in Treatment: Improving Attending groups: Yes  Participating in groups: Yes  Taking medication as prescribed: Yes  Tolerating medication: Yes  Family/Significant other contact made: Yes, CSW has spoken with grandmother, Collene Mares  Patient understands diagnosis: Yes  Discussing patient identified problems/goals with staff: Yes  Medical problems stabilized or resolved: Yes  Denies suicidal/homicidal ideation: Yes  Issues/concerns per patient self-inventory: Yes  Other:   New problem(s) identified: N/A   Discharge Plan or Barriers: see below    Reason for Continuation of Hospitalization:   Depression   Anxiety   Medication Stabilization   Comments: N/A   Estimated discharge date: 06/23/16    Patient is a 25year old male admitted for bizarre behaviors and delusional thinking. Patient lives in [place], [place]. Patient will benefit from crisis stabilization, medication evaluation, group therapy, and psycho education in addition to case management for discharge planning. Patient and CSW reviewed pt's identified goals and treatment plan. Pt verbalized understanding and agreed to treatment plan.    Review of initial/current patient goals per problem list:  1. Goal(s): Patient will participate in aftercare plan   Met: Yes  Target date: 3-5 days post admission date   As evidenced by: Patient will participate within aftercare plan AEB aftercare provider and housing plan at discharge being identified.   06/20/16: Patient will follow-up with [facility] in [place]   2. Goal (s): Patient will exhibit decreased depressive symptoms and suicidal ideations.   Met: Yes  Target date: 3-5 days post admission date   As evidenced by: Patient will utilize self-rating of depression at 3 or below and demonstrate decreased signs of depression or be deemed stable for discharge by MD.   06/20/16: pt denies SI/HI at this time. Rates depression score at a 3 at this time. 06/23/16: Adequate for discharge per MD. Pt denies SI/HI.  Pt reports he is safe for discharge.    3. Goal(s): Patient will demonstrate decreased signs of psychosis  * Met: Yes * Target date: 3-5 days post admission date  * As evidenced by: Patient will demonstrate decreased frequency of AVH or return to baseline function  06/20/16: Patient denies AVH at this time. Dr. Lemmie Evens is still evaluating and adjusting medications. 06/23/16: Patient denies AVH at this time. Adequate for discharge per MD.   4. Goal (s): Patient will demonstrate decreased signs of mania  * Met: Yes * Target date: 3-5 days post admission date  * As evidenced by: Patient demonstrate decreased signs of mania AEB decreased mood instability and demonstration of stable mood  06/20/16: MD is still evaluating and making sure pt is stabilized before d/c. 06/23/16: Adequate for discharge per MD.   Attendees:  Patient: Tom [name] Family:  Physician: Dr. Hildred Priest, MD   06/23/2016 10:30 AM  Nursing: Carolynn Sayers , RN     06/23/2016 10:30 AM  Clinical Social Worker: Emilie Rutter, [place]  06/23/2016 10:30 AM

## 2019-06-01 ENCOUNTER — Encounter (HOSPITAL_COMMUNITY): Payer: Self-pay | Admitting: Emergency Medicine

## 2019-06-01 ENCOUNTER — Emergency Department (HOSPITAL_COMMUNITY): Payer: Medicaid Other

## 2019-06-01 ENCOUNTER — Emergency Department (HOSPITAL_COMMUNITY)
Admission: EM | Admit: 2019-06-01 | Discharge: 2019-06-01 | Disposition: A | Discharge status: Home / Self Care | Payer: Medicaid Other | Attending: Emergency Medicine | Admitting: Emergency Medicine

## 2019-06-01 ENCOUNTER — Other Ambulatory Visit: Payer: Self-pay

## 2019-06-01 DIAGNOSIS — F209 Schizophrenia, unspecified: Secondary | ICD-10-CM | POA: Insufficient documentation

## 2019-06-01 DIAGNOSIS — M25511 Pain in right shoulder: Secondary | ICD-10-CM

## 2019-06-01 DIAGNOSIS — F419 Anxiety disorder, unspecified: Secondary | ICD-10-CM | POA: Insufficient documentation

## 2019-06-01 DIAGNOSIS — I10 Essential (primary) hypertension: Secondary | ICD-10-CM | POA: Insufficient documentation

## 2019-06-01 DIAGNOSIS — R0602 Shortness of breath: Secondary | ICD-10-CM | POA: Insufficient documentation

## 2019-06-01 DIAGNOSIS — R748 Abnormal levels of other serum enzymes: Secondary | ICD-10-CM | POA: Insufficient documentation

## 2019-06-01 DIAGNOSIS — R1013 Epigastric pain: Secondary | ICD-10-CM | POA: Insufficient documentation

## 2019-06-01 HISTORY — DX: Anxiety disorder, unspecified: F41.9

## 2019-06-01 LAB — ACETAMINOPHEN LEVEL: Acetaminophen (Tylenol), Serum: <10 ug/mL — ABNORMAL LOW (ref 10–30)

## 2019-06-01 LAB — URINALYSIS, ROUTINE W REFLEX MICROSCOPIC
Bilirubin Urine: NEGATIVE
Glucose, UA: NEGATIVE mg/dL
Ketones, ur: 20 mg/dL — AB
Leukocytes,Ua: NEGATIVE
Nitrite: NEGATIVE
Protein, ur: 100 mg/dL — AB
Specific Gravity, Urine: 1.026 (ref 1.005–1.030)
pH: 5 (ref 5.0–8.0)

## 2019-06-01 LAB — CBC WITH DIFFERENTIAL/PLATELET
Abs Immature Granulocytes: 0.2 10*3/uL — ABNORMAL HIGH (ref 0.00–0.07)
Basophils Absolute: 0.1 10*3/uL (ref 0.0–0.1)
Basophils Relative: 0 %
Eosinophils Absolute: 0 10*3/uL (ref 0.0–0.5)
Eosinophils Relative: 0 %
HCT: 46.4 % (ref 39.0–52.0)
Hemoglobin: 16 g/dL (ref 13.0–17.0)
Immature Granulocytes: 1 %
Lymphocytes Relative: 12 %
Lymphs Abs: 2.1 10*3/uL (ref 0.7–4.0)
MCH: 29.6 pg (ref 26.0–34.0)
MCHC: 34.5 g/dL (ref 30.0–36.0)
MCV: 85.9 fL (ref 80.0–100.0)
Monocytes Absolute: 1.1 10*3/uL — ABNORMAL HIGH (ref 0.1–1.0)
Monocytes Relative: 6 %
Neutro Abs: 13.7 10*3/uL — ABNORMAL HIGH (ref 1.7–7.7)
Neutrophils Relative %: 81 %
Platelets: 289 10*3/uL (ref 150–400)
RBC: 5.4 MIL/uL (ref 4.22–5.81)
RDW: 12.8 % (ref 11.5–15.5)
WBC: 17.1 10*3/uL — ABNORMAL HIGH (ref 4.0–10.5)
nRBC: 0 % (ref 0.0–0.2)

## 2019-06-01 LAB — D-DIMER, QUANTITATIVE: D-Dimer, Quant: <0.27 ug/mL-FEU (ref 0.00–0.50)

## 2019-06-01 LAB — RAPID URINE DRUG SCREEN, HOSP PERFORMED
Amphetamines: NOT DETECTED
Barbiturates: NOT DETECTED
Benzodiazepines: NOT DETECTED
Cocaine: NOT DETECTED
Opiates: NOT DETECTED
Tetrahydrocannabinol: NOT DETECTED

## 2019-06-01 LAB — SEDIMENTATION RATE: Sed Rate: 2 mm/hr (ref 0–16)

## 2019-06-01 LAB — COMPREHENSIVE METABOLIC PANEL
ALT: 93 U/L — ABNORMAL HIGH (ref 0–44)
AST: 59 U/L — ABNORMAL HIGH (ref 15–41)
Albumin: 4.4 g/dL (ref 3.5–5.0)
Alkaline Phosphatase: 57 U/L (ref 38–126)
Anion gap: 15 (ref 5–15)
BUN: 25 mg/dL — ABNORMAL HIGH (ref 6–20)
CO2: 16 mmol/L — ABNORMAL LOW (ref 22–32)
Calcium: 9.9 mg/dL (ref 8.9–10.3)
Chloride: 107 mmol/L (ref 98–111)
Creatinine, Ser: 1.19 mg/dL (ref 0.61–1.24)
GFR calc Af Amer: >60 mL/min (ref >60)
GFR calc non Af Amer: >60 mL/min (ref >60)
Glucose, Bld: 141 mg/dL — ABNORMAL HIGH (ref 70–99)
Potassium: 3.9 mmol/L (ref 3.5–5.1)
Sodium: 138 mmol/L (ref 135–145)
Total Bilirubin: 0.9 mg/dL (ref 0.3–1.2)
Total Protein: 7.3 g/dL (ref 6.5–8.1)

## 2019-06-01 LAB — SALICYLATE LEVEL: Salicylate Lvl: <7 mg/dL (ref 2.8–30.0)

## 2019-06-01 LAB — TROPONIN I
Troponin I: 0.04 ng/mL (ref <0.03)
Troponin I: 0.03 ng/mL (ref <0.03)

## 2019-06-01 LAB — C-REACTIVE PROTEIN: CRP: 1.3 mg/dL — ABNORMAL HIGH (ref <1.0)

## 2019-06-01 LAB — LIPASE, BLOOD: Lipase: 57 U/L — ABNORMAL HIGH (ref 11–51)

## 2019-06-01 MED ORDER — COLCHICINE 0.6 MG PO TABS: 0.6000 mg | ORAL_TABLET | Freq: Two times a day (BID) | ORAL | 0 refills | Status: AC

## 2019-06-01 MED ORDER — IOHEXOL 350 MG/ML SOLN
100.0000 mL | Freq: Once | INTRAVENOUS | Status: AC | PRN
  Administered 2019-06-01: 100 mL via INTRAVENOUS

## 2019-06-01 MED ORDER — SODIUM CHLORIDE 0.9 % IV BOLUS
1000.0000 mL | Freq: Once | INTRAVENOUS | Status: AC
  Administered 2019-06-01: 12:00:00 1000 mL via INTRAVENOUS

## 2019-06-01 NOTE — ED Notes (Signed)
Pt states pain to shoulders, "pressure" down to a 6 from admit 8-9.   Reports recent new hx of anxiety and treatment, felt this morning it was different.   Denies any SI, pleasant and talkative.  States he feels SOB even at rest without noted acute distress or tachypnea.

## 2019-06-01 NOTE — ED Notes (Signed)
Patient transported to X-ray 

## 2019-06-01 NOTE — ED Provider Notes (Signed)
[facility] Provider Note   CSN: [id number] Arrival date & time: 06/01/19  0706    History   Chief Complaint Chief Complaint  Patient presents with  . Anxiety    HPI Tom [name] is a 28 y.o. male with history of hypertension, schizophrenia, obesity who presents via EMS from a gas station for evaluation of anxiety.  Patient reports he has been having pain in his right shoulder and chest with shortness of breath and tightness taking a deep breath.  He has pain in his right upper back.  All of this has been intermittent and coming in waves.  He has no official diagnosis of anxiety, but has been seeing a provider at [facility] in [place]. Patient just began taking Seroquel 4 days ago.  Patient reports he does not believe he has schizophrenia anymore.  He feels like the symptoms he is having are related to anxiety, however he is not sure.   He is not currently taking any other medications.  Per chart review, he had been on Abilify in the past.  Patient is currently homeless and walked all night from [place] to get to the homeless shelter here in [place].  Patient reports he is been having trouble with insomnia.  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  He denies any SI, HI, AVH.  He denies any recent long trips, surgeries, known cancer, new leg pain or swelling, history of blood clots.  He has no pain in his chest or trouble breathing at this time.  Reports he occasionally smokes cigarettes, but denies any drug or alcohol use.     HPI  Past Medical History:  Diagnosis Date  . Anxiety     Patient Active Problem List   Diagnosis Date Noted  . Undifferentiated schizophrenia (HCC)   . Schizophrenia (HCC) 06/20/2016  . Tobacco use disorder 06/19/2016  . Essential hypertension 06/19/2016  . Obesity 06/19/2016    History reviewed. No pertinent  surgical history.      Home Medications    Prior to Admission medications   Medication Sig Start Date End Date Taking? Authorizing Provider  aspirin 325 MG tablet Take 325 mg by mouth every 6 (six) hours as needed for mild pain or headache.   Yes [provider]  cetirizine (ZYRTEC) 10 MG tablet Take 10 mg by mouth daily as needed for allergies.   Yes [provider]  QUEtiapine (SEROQUEL) 25 MG tablet Take 25-50 mg by mouth See admin instructions. Taking 1 tablet (25mg ) in the AM, then 2 tablets (50mg ) at bedtime 05/26/19  Yes [provider]  ARIPiprazole (ABILIFY) 20 MG tablet Take 1 tablet (20 mg total) by mouth daily. Patient not taking: Reported on 06/01/2019 06/23/16   Jimmy FootmanHernandez-Gonzalez, Andrea, MD  ARIPiprazole ER 400 MG SUSR Inject 400 mg into the muscle every 30 (thirty) days. Patient not taking: Reported on 06/01/2019 06/23/16   Jimmy FootmanHernandez-Gonzalez, Andrea, MD  colchicine 0.6 MG tablet Take 1 tablet (0.6 mg total) by mouth 2 (two) times daily. 06/01/19   Quinnten Calvin, Waylan BogaAlexandra M, PA-C  diphenhydrAMINE (BENADRYL) 50 MG capsule Take 1 capsule (50 mg total) by mouth at bedtime. Patient not taking: Reported on 06/01/2019 06/23/16   Jimmy FootmanHernandez-Gonzalez, Andrea, MD  hydrochlorothiazide (MICROZIDE) 12.5 MG capsule Take 1 capsule (12.5 mg total) by mouth daily. Patient not taking: Reported on 06/01/2019 06/23/16   Jimmy FootmanHernandez-Gonzalez, Andrea,  MD    Family History No family history on file.  Social History Social History   Tobacco Use  . Smoking status: Never Smoker  Substance Use Topics  . Alcohol use: No  . Drug use: No     Allergies   Penicillins   Review of Systems Review of Systems  Constitutional: Negative for chills and fever.  HENT: Negative for facial swelling and sore throat.   Respiratory: Negative for shortness of breath.   Cardiovascular: Negative for chest pain.  Gastrointestinal: Positive for abdominal pain. Negative for nausea and vomiting.   Genitourinary: Negative for dysuria.  Musculoskeletal: Positive for back pain.  Skin: Negative for rash and wound.  Neurological: Negative for headaches.  Psychiatric/Behavioral: Positive for sleep disturbance. Negative for hallucinations and suicidal ideas. The patient is nervous/anxious.      Physical Exam Updated Vital Signs BP (!) 142/73 (BP Location: Left Arm)   Pulse 94   Temp 98.7 F (37.1 C) (Oral)   Resp 18   SpO2 97%   Physical Exam Vitals signs and nursing note reviewed.  Constitutional:      General: He is not in acute distress.    Appearance: He is well-developed. He is not diaphoretic.  HENT:     Head: Normocephalic and atraumatic.     Mouth/Throat:     Pharynx: No oropharyngeal exudate.  Eyes:     General: No scleral icterus.       Right eye: No discharge.        Left eye: No discharge.     Conjunctiva/sclera: Conjunctivae normal.     Pupils: Pupils are equal, round, and reactive to light.  Neck:     Musculoskeletal: Normal range of motion and neck supple.     Thyroid: No thyromegaly.  Cardiovascular:     Rate and Rhythm: Normal rate and regular rhythm.     Heart sounds: Normal heart sounds. No murmur. No friction rub. No gallop.   Pulmonary:     Effort: Pulmonary effort is normal. No respiratory distress.     Breath sounds: Normal breath sounds. No stridor. No wheezing or rales.  Abdominal:     General: Bowel sounds are normal. There is no distension.     Palpations: Abdomen is soft.     Tenderness: There is abdominal tenderness in the epigastric area. There is no guarding or rebound.     Comments: Mild, patient described as bloating sensation  Lymphadenopathy:     Cervical: No cervical adenopathy.  Skin:    General: Skin is warm and dry.     Coloration: Skin is not pale.     Findings: No rash.  Neurological:     Mental Status: He is alert.     Coordination: Coordination normal.  Psychiatric:        Attention and Perception: He does not  perceive auditory or visual hallucinations.        Mood and Affect: Mood normal.        Speech: Speech normal.        Behavior: Behavior normal.        Thought Content: Thought content does not include homicidal or suicidal ideation.        Cognition and Memory: He does not exhibit impaired recent memory or impaired remote memory.      ED Treatments / Results  Labs (all labs ordered are listed, but only abnormal results are displayed) Labs Reviewed  COMPREHENSIVE METABOLIC PANEL - Abnormal; Notable for the following components:  Result Value   CO2 16 (*)    Glucose, Bld 141 (*)    BUN 25 (*)    AST 59 (*)    ALT 93 (*)    All other components within normal limits  CBC WITH DIFFERENTIAL/PLATELET - Abnormal; Notable for the following components:   WBC 17.1 (*)    Neutro Abs 13.7 (*)    Monocytes Absolute 1.1 (*)    Abs Immature Granulocytes 0.20 (*)    All other components within normal limits  TROPONIN I - Abnormal; Notable for the following components:   Troponin I 0.04 (*)    All other components within normal limits  LIPASE, BLOOD - Abnormal; Notable for the following components:   Lipase 57 (*)    All other components within normal limits  ACETAMINOPHEN LEVEL - Abnormal; Notable for the following components:   Acetaminophen (Tylenol), Serum <10 (*)    All other components within normal limits  URINALYSIS, ROUTINE W REFLEX MICROSCOPIC - Abnormal; Notable for the following components:   Hgb urine dipstick SMALL (*)    Ketones, ur 20 (*)    Protein, ur 100 (*)    Bacteria, UA RARE (*)    All other components within normal limits  TROPONIN I - Abnormal; Notable for the following components:   Troponin I 0.03 (*)    All other components within normal limits  C-REACTIVE PROTEIN - Abnormal; Notable for the following components:   CRP 1.3 (*)    All other components within normal limits  SALICYLATE LEVEL  RAPID URINE DRUG SCREEN, HOSP PERFORMED  D-DIMER, QUANTITATIVE  (NOT AT [facility])  SEDIMENTATION RATE    EKG EKG Interpretation  Date/Time:  Wednesday June 01 2019 08:13:00 EDT Ventricular Rate:  104 PR Interval:    QRS Duration: 82 QT Interval:  328 QTC Calculation: 432 R Axis:   51 Text Interpretation:  Sinus tachycardia No previous ECGs available Confirmed by Richardean CanalYao, David H [phone]([id number]) on 06/01/2019 10:05:25 AM   Radiology Dg Chest 2 View  Result Date: 06/01/2019 CLINICAL DATA:  Intermittent chest pain for weeks. Right shoulder pain. EXAM: CHEST - 2 VIEW COMPARISON:  October 24, 2018 FINDINGS: The heart size and mediastinal contours are within normal limits. Both lungs are clear. The visualized skeletal structures are unremarkable. IMPRESSION: No active cardiopulmonary disease. Electronically Signed   By: Gerome Samavid  Williams III M.D   On: 06/01/2019 08:28   Ct Abdomen Pelvis W Contrast  Result Date: 06/01/2019 CLINICAL DATA:  Epigastric pain. Abnormal LFT and lipase. Rule out cholecystitis EXAM: CT ABDOMEN AND PELVIS WITH CONTRAST TECHNIQUE: Multidetector CT imaging of the abdomen and pelvis was performed using the standard protocol following bolus administration of intravenous contrast. CONTRAST:  100mL OMNIPAQUE IOHEXOL 350 MG/ML SOLN COMPARISON:  None. FINDINGS: Lower chest: Lung bases clear bilaterally.  Heart size normal. Hepatobiliary: Diffuse fatty infiltration of the liver without focal liver lesion. Gallbladder and bile ducts normal. Pancreas: Negative Spleen: Negative Adrenals/Urinary Tract: Adrenal glands are unremarkable. Kidneys are normal, without renal calculi, focal lesion, or hydronephrosis. Bladder is unremarkable. Stomach/Bowel: Stomach is within normal limits. Appendix appears normal. No evidence of bowel wall thickening, distention, or inflammatory changes. Vascular/Lymphatic: No significant vascular findings are present. No enlarged abdominal or pelvic lymph nodes. Reproductive: Normal prostate Other: Small inguinal hernia bilaterally  containing fat Musculoskeletal: Negative IMPRESSION: Fatty infiltration liver. No evidence of cholecystitis or pancreatitis. Normal appendix. Electronically Signed   By: Marlan Palauharles  Clark M.D.   On: 06/01/2019 12:53  Procedures Procedures (including critical care time)  Medications Ordered in ED Medications  sodium chloride 0.9 % bolus 1,000 mL (0 mLs Intravenous Stopped 06/01/19 1330)  iohexol (OMNIPAQUE) 350 MG/ML injection 100 mL (100 mLs Intravenous Contrast Given 06/01/19 1229)     Initial Impression / Assessment and Plan / ED Course  I have reviewed the triage vital signs and the nursing notes.  Pertinent labs & imaging results that were available during my care of the patient were reviewed by me and considered in my medical decision making (see chart for details).        Patient presenting with episodes of right shoulder pain, front and back, shortness of breath.  He feels like he was having a panic attack.  He reports some related chest tightness.  Patient found to have leukocytosis at 17,000, elevated troponin at 0.04.  Repeat troponin 0.03.  D-dimer is negative.  EKG showed sinus tachycardia.  Patient having some epigastric discomfort, no significant right upper quadrant tenderness.  His LFTs are elevated, however states they have been elevated in the past. (Seroquel could also do this.) Lipase is also mildly elevated.  CT abdomen pelvis shows fatty liver, but otherwise stable.  Bedside echocardiogram performed by Dr. Darl Householder shows no pericardial effusion.  Will send sed rate and CRP for trending, begin colchicine, and follow-up with cardiology for possible pericarditis.  Low suspicion of ACS at this time.  Patient is feeling well at time of discharge.  He does note that he had some type of viral illness over the winter.  Strict return precautions given.  Patient also advised to follow-up and establish care with a primary care provider.  He denies any SI, HI, AVH.  Do not feel TTS  involvement necessary at this time.  Patient advised to continue following up with [facility] for further management of his anxiety.  Patient understands and agrees with plan.  Patient vital stable throughout ED course and discharged in satisfactory condition.  Patient also evaluated by my attending, Dr. Darl Householder, who guided the patient's management and agrees with plan  Final Clinical Impressions(s) / ED Diagnoses   Final diagnoses:  Anxiety  Acute pain of right shoulder  Shortness of breath    ED Discharge Orders         Ordered    colchicine 0.6 MG tablet  2 times daily     06/01/19 1510           Britlyn Martine, [place], PA-C 06/01/19 1657    Drenda Freeze, MD 06/02/19 [phone]
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
## 2019-06-01 NOTE — ED Notes (Signed)
This RN acting as Art therapist and asked pt if he would like for me to call family /friends for him. Pt declined and states he is able to keep them informed.

## 2019-06-01 NOTE — Discharge Instructions (Addendum)
Take colchicine as prescribed until you see cardiology.  Please follow-up with cardiology as soon as possible by calling the number below.  Please continue following up with [facility] for further evaluation and treatment of your anxiety.  Please follow-up with 1 of the primary care provider as below for further evaluation and follow-up of your mildly elevated liver function tests. Please return to the emergency department if you develop any new or worsening symptoms.

## 2019-07-17 ENCOUNTER — Encounter (HOSPITAL_COMMUNITY): Payer: Self-pay

## 2019-07-17 ENCOUNTER — Other Ambulatory Visit: Payer: Self-pay

## 2019-07-17 ENCOUNTER — Emergency Department (HOSPITAL_COMMUNITY): Payer: Medicaid Other

## 2019-07-17 ENCOUNTER — Emergency Department (HOSPITAL_COMMUNITY)
Admission: EM | Admit: 2019-07-17 | Discharge: 2019-07-17 | Disposition: A | Discharge status: Home / Self Care | Payer: Medicaid Other | Attending: Emergency Medicine | Admitting: Emergency Medicine

## 2019-07-17 DIAGNOSIS — R002 Palpitations: Secondary | ICD-10-CM | POA: Insufficient documentation

## 2019-07-17 DIAGNOSIS — I10 Essential (primary) hypertension: Secondary | ICD-10-CM | POA: Insufficient documentation

## 2019-07-17 DIAGNOSIS — Z79899 Other long term (current) drug therapy: Secondary | ICD-10-CM | POA: Insufficient documentation

## 2019-07-17 DIAGNOSIS — R0789 Other chest pain: Secondary | ICD-10-CM

## 2019-07-17 DIAGNOSIS — R1013 Epigastric pain: Secondary | ICD-10-CM | POA: Insufficient documentation

## 2019-07-17 MED ORDER — FAMOTIDINE 20 MG PO TABS: 20.0000 mg | ORAL_TABLET | Freq: Every day | ORAL | 0 refills | Status: DC

## 2019-07-17 MED ORDER — ALUM & MAG HYDROXIDE-SIMETH 200-200-20 MG/5ML PO SUSP
30.0000 mL | Freq: Once | ORAL | Status: AC
  Administered 2019-07-17: 15:00:00 30 mL via ORAL
  Filled 2019-07-17: qty 30

## 2019-07-17 MED ORDER — LIDOCAINE VISCOUS HCL 2 % MT SOLN
15.0000 mL | Freq: Once | OROMUCOSAL | Status: AC
  Administered 2019-07-17: 15 mL via ORAL
  Filled 2019-07-17: qty 15

## 2019-07-17 MED ORDER — FAMOTIDINE 20 MG PO TABS: 20.0000 mg | ORAL_TABLET | Freq: Every day | ORAL | 0 refills | Status: AC

## 2019-07-17 NOTE — ED Triage Notes (Signed)
Pt states he has run out of his seroquel as well.

## 2019-07-17 NOTE — ED Triage Notes (Signed)
Pt states that he is having pain in the center of his chest pain. Pt states that he has hx of anxiety. Pt states that he has been having SHOB as well.

## 2019-07-17 NOTE — ED Notes (Signed)
States some relief with medications given, no reaction to medication noted.

## 2019-07-17 NOTE — ED Provider Notes (Signed)
[facility] DEPT Provider Note   CSN: [id number] Arrival date & time: 07/17/19  1230     History   Chief Complaint Chief Complaint  Patient presents with  . Anxiety    HPI Tom [name] is a 28 y.o. male who  has a past medical history of Anxiety and schizophrenia. He has 2 days of retrosternal and epigastric pain. Pain is not increased with exertion. He denies nausea or vomiting. Pain is worse when lying down. Described as burning. Sometime he feels tightness and racing in his chest. He denies diaphoresis. Pain does not radiate. RF include obesity and smoking. Patient has a history of gastroesophageal reflux and states that he used to get a sore throat all the time.  This feels similar.  It is relieved by belching.  He does not take any acid reducing medications.         HPI  Past Medical History:  Diagnosis Date  . Anxiety     Patient Active Problem List   Diagnosis Date Noted  . Undifferentiated schizophrenia ([place])   . Schizophrenia ([place]) 06/20/2016  . Tobacco use disorder 06/19/2016  . Essential hypertension 06/19/2016  . Obesity 06/19/2016    History reviewed. No pertinent surgical history.      Home Medications    Prior to Admission medications   Medication Sig Start Date End Date Taking? Authorizing Provider  ARIPiprazole (ABILIFY) 20 MG tablet Take 1 tablet (20 mg total) by mouth daily. Patient not taking: Reported on 06/01/2019 06/23/16   Hildred Priest, MD  ARIPiprazole ER 400 MG SUSR Inject 400 mg into the muscle every 30 (thirty) days. Patient not taking: Reported on 06/01/2019 06/23/16   Hildred Priest, MD  aspirin 325 MG tablet Take 325 mg by mouth every 6 (six) hours as needed for mild pain or headache.    [provider]  cetirizine (ZYRTEC) 10 MG tablet Take 10 mg by mouth daily as needed for allergies.    [provider]  colchicine 0.6 MG tablet Take 1 tablet (0.6 mg total)  by mouth 2 (two) times daily. 06/01/19   Law, Bea Graff, PA-C  diphenhydrAMINE (BENADRYL) 50 MG capsule Take 1 capsule (50 mg total) by mouth at bedtime. Patient not taking: Reported on 06/01/2019 06/23/16   Hildred Priest, MD  hydrochlorothiazide (MICROZIDE) 12.5 MG capsule Take 1 capsule (12.5 mg total) by mouth daily. Patient not taking: Reported on 06/01/2019 06/23/16   Hildred Priest, MD  QUEtiapine (SEROQUEL) 25 MG tablet Take 25-50 mg by mouth See admin instructions. Taking 1 tablet (25mg ) in the AM, then 2 tablets (50mg ) at bedtime 05/26/19   [provider]    Family History No family history on file.  Social History Social History   Tobacco Use  . Smoking status: Never Smoker  . Smokeless tobacco: Never Used  Substance Use Topics  . Alcohol use: No  . Drug use: No     Allergies   Penicillins   Review of Systems Review of Systems Ten systems reviewed and are negative for acute change, except as noted in the HPI.    Physical Exam Updated Vital Signs BP (!) 142/93 (BP Location: Left Arm)   Pulse 98   Temp 98.8 F (37.1 C) (Oral)   Resp 16   Ht 5\' 11"  (1.803 m)   Wt 104.3 kg   SpO2 99%   BMI 32.08 kg/m   Physical Exam Vitals signs and nursing note reviewed.  Constitutional:  General: He is not in acute distress.    Appearance: He is well-developed. He is not diaphoretic.  HENT:     Head: Normocephalic and atraumatic.  Eyes:     General: No scleral icterus.    Conjunctiva/sclera: Conjunctivae normal.  Neck:     Musculoskeletal: Normal range of motion and neck supple.  Cardiovascular:     Rate and Rhythm: Normal rate and regular rhythm.     Heart sounds: Normal heart sounds.  Pulmonary:     Effort: Pulmonary effort is normal. No respiratory distress.     Breath sounds: Normal breath sounds.  Abdominal:     Palpations: Abdomen is soft.     Tenderness: There is no abdominal tenderness.  Skin:    General: Skin is  warm and dry.  Neurological:     Mental Status: He is alert.  Psychiatric:        Behavior: Behavior normal.      ED Treatments / Results  Labs (all labs ordered are listed, but only abnormal results are displayed) Labs Reviewed - No data to display  EKG EKG Interpretation  Date/Time:  "Sunday July 17 2019 13:11:24 EDT Ventricular Rate:  89 PR Interval:    QRS Duration: 98 QT Interval:  354 QTC Calculation: 431 R Axis:   45 Text Interpretation:  Sinus rhythm RSR' in V1 or V2, probably normal variant ST elevation, inferior leads, similar to previous  Confirmed by Knapp, Jon ([id number]) on 07/17/2019 1:52:29 PM   Radiology No results found.  Procedures Procedures (including critical care time)  Medications Ordered in ED Medications  alum & mag hydroxide-simeth (MAALOX/MYLANTA) 200-200-20 MG/5ML suspension 30 mL (has no administration in time range)    And  lidocaine (XYLOCAINE) 2 % viscous mouth solution 15 mL (has no administration in time range)     Initial Impression / Assessment and Plan / ED Course  I have reviewed the triage vital signs and the nursing notes.  Pertinent labs & imaging results that were available during my care of the patient were reviewed by me and considered in my medical decision making (see chart for details).        27"  y/o male with a pmh of reflux and anxiety.  EKG is unchanged from previous tracings.  I personally reviewed the patient's 2 view chest x-ray which was without abnormality.  He had total resolution and relief of his symptoms with GI cocktail.  I believe this is all related to reflux and will start the patient on Pepcid daily.  He can follow-up with his primary care physician.  Regarding his request for Seroquel refill the patient states that he was given a one-month prescription by his provider and was not sure if he was supposed to continue taking or not.  Of asked the patient to call the prescribing physician and follow-up with it  tomorrow to verify if he is taking the appropriate medication and should continue it.  Patient otherwise appears appropriate for discharge at this time.  If he continues to have episodes of chest pain he may follow-up with [facility] for stress testing.  Advised him to quit smoking.The patient was counseled on the dangers of tobacco use, and was advised to quit. Reviewed strategies to maximize success, including removing cigarettes and smoking materials from environment, stress management, substitution of other forms of reinforcement, support of family/friends and written materials.   Final Clinical Impressions(s) / ED Diagnoses   Final diagnoses:  Atypical chest pain  ED Discharge Orders    None       Arthor CaptainHarris, Chaselyn Nanney, PA-C 07/17/19 1602    Linwood DibblesKnapp, Jon, MD 07/18/19 1110

## 2019-07-17 NOTE — Discharge Instructions (Addendum)

## 2019-07-23 ENCOUNTER — Other Ambulatory Visit: Payer: Self-pay

## 2019-07-23 ENCOUNTER — Emergency Department (HOSPITAL_COMMUNITY)
Admission: EM | Admit: 2019-07-23 | Discharge: 2019-07-24 | Disposition: A | Discharge status: Home / Self Care | Payer: Medicaid Other | Attending: Emergency Medicine | Admitting: Emergency Medicine

## 2019-07-23 ENCOUNTER — Encounter (HOSPITAL_COMMUNITY): Payer: Self-pay | Admitting: Emergency Medicine

## 2019-07-23 DIAGNOSIS — R197 Diarrhea, unspecified: Secondary | ICD-10-CM

## 2019-07-23 DIAGNOSIS — Z79899 Other long term (current) drug therapy: Secondary | ICD-10-CM | POA: Insufficient documentation

## 2019-07-23 DIAGNOSIS — R110 Nausea: Secondary | ICD-10-CM | POA: Insufficient documentation

## 2019-07-23 DIAGNOSIS — K2900 Acute gastritis without bleeding: Secondary | ICD-10-CM

## 2019-07-23 DIAGNOSIS — I10 Essential (primary) hypertension: Secondary | ICD-10-CM | POA: Insufficient documentation

## 2019-07-23 DIAGNOSIS — Z590 Homelessness: Secondary | ICD-10-CM | POA: Insufficient documentation

## 2019-07-23 NOTE — ED Triage Notes (Signed)
Pt reports having abdominal pain and diarrhea for the last several days.

## 2019-07-24 LAB — BASIC METABOLIC PANEL
Anion gap: 10 (ref 5–15)
BUN: 8 mg/dL (ref 6–20)
CO2: 21 mmol/L — ABNORMAL LOW (ref 22–32)
Calcium: 9.1 mg/dL (ref 8.9–10.3)
Chloride: 111 mmol/L (ref 98–111)
Creatinine, Ser: 0.75 mg/dL (ref 0.61–1.24)
GFR calc Af Amer: >60 mL/min (ref >60)
GFR calc non Af Amer: >60 mL/min (ref >60)
Glucose, Bld: 90 mg/dL (ref 70–99)
Potassium: 3.4 mmol/L — ABNORMAL LOW (ref 3.5–5.1)
Sodium: 142 mmol/L (ref 135–145)

## 2019-07-24 MED ORDER — METOCLOPRAMIDE HCL 5 MG/ML IJ SOLN
10.0000 mg | Freq: Once | INTRAMUSCULAR | Status: AC
  Administered 2019-07-24: 10 mg via INTRAVENOUS
  Filled 2019-07-24: qty 2

## 2019-07-24 MED ORDER — METOCLOPRAMIDE HCL 10 MG PO TABS: 10.0000 mg | ORAL_TABLET | Freq: Four times a day (QID) | ORAL | 0 refills | Status: DC

## 2019-07-24 MED ORDER — SODIUM CHLORIDE 0.9 % IV BOLUS
1000.0000 mL | Freq: Once | INTRAVENOUS | Status: AC
  Administered 2019-07-24: 1000 mL via INTRAVENOUS

## 2019-07-24 NOTE — ED Notes (Signed)
Patient is resting comfortably. 

## 2019-07-24 NOTE — ED Notes (Signed)
Pt given water for PO challenge. He was tolerating it well.

## 2019-07-24 NOTE — ED Provider Notes (Signed)
[facility]-EMERGENCY DEPT Provider Note   CSN: [id number] Arrival date & time: 07/23/19  2313     History   Chief Complaint Chief Complaint  Patient presents with   Abdominal Pain   Diarrhea    HPI Tom [name] is a 28 y.o. male.     Patient to ED with complaint of epigastric abdominal pain, nausea without vomiting and post-prandial diarrhea x 3-4 days. He reports he is homeless and was given some food just prior to when symptoms began, and is concerned about food poisoning. No fever, chest pain, SOB, urinary symptoms. No bloody stools. He denies regular or large quantity alcohol use.   The history is provided by the patient. No language interpreter was used.  Abdominal Pain Associated symptoms: diarrhea and nausea   Associated symptoms: no fever and no vomiting   Diarrhea Associated symptoms: abdominal pain   Associated symptoms: no fever and no vomiting     Past Medical History:  Diagnosis Date   Anxiety     Patient Active Problem List   Diagnosis Date Noted   Undifferentiated schizophrenia (HCC)    Schizophrenia (HCC) 06/20/2016   Tobacco use disorder 06/19/2016   Essential hypertension 06/19/2016   Obesity 06/19/2016    History reviewed. No pertinent surgical history.      Home Medications    Prior to Admission medications   Medication Sig Start Date End Date Taking? Authorizing Provider  ARIPiprazole (ABILIFY) 20 MG tablet Take 1 tablet (20 mg total) by mouth daily. Patient not taking: Reported on 06/01/2019 06/23/16   Jimmy FootmanHernandez-Gonzalez, Andrea, MD  ARIPiprazole ER 400 MG SUSR Inject 400 mg into the muscle every 30 (thirty) days. Patient not taking: Reported on 06/01/2019 06/23/16   Jimmy FootmanHernandez-Gonzalez, Andrea, MD  aspirin 325 MG tablet Take 325 mg by mouth every 6 (six) hours as needed for mild pain or headache.    [provider]  cetirizine (ZYRTEC) 10 MG tablet Take 10 mg by mouth daily as needed for allergies.     [provider]  colchicine 0.6 MG tablet Take 1 tablet (0.6 mg total) by mouth 2 (two) times daily. 06/01/19   Law, Waylan BogaAlexandra M, PA-C  diphenhydrAMINE (BENADRYL) 50 MG capsule Take 1 capsule (50 mg total) by mouth at bedtime. Patient not taking: Reported on 06/01/2019 06/23/16   Jimmy FootmanHernandez-Gonzalez, Andrea, MD  famotidine (PEPCID) 20 MG tablet Take 1 tablet (20 mg total) by mouth daily. OTC 07/17/19   Arthor CaptainHarris, Abigail, PA-C  hydrochlorothiazide (MICROZIDE) 12.5 MG capsule Take 1 capsule (12.5 mg total) by mouth daily. Patient not taking: Reported on 06/01/2019 06/23/16   Jimmy FootmanHernandez-Gonzalez, Andrea, MD  QUEtiapine (SEROQUEL) 25 MG tablet Take 25-50 mg by mouth See admin instructions. Taking 1 tablet (25mg ) in the AM, then 2 tablets (50mg ) at bedtime 05/26/19   [provider]    Family History History reviewed. No pertinent family history.  Social History Social History   Tobacco Use   Smoking status: Never Smoker   Smokeless tobacco: Never Used  Substance Use Topics   Alcohol use: No   Drug use: No     Allergies   Penicillins   Review of Systems Review of Systems  Constitutional: Negative for fever.  HENT: Negative.   Respiratory: Negative.   Cardiovascular: Negative.   Gastrointestinal: Positive for abdominal pain, diarrhea and nausea. Negative for blood in stool and vomiting.  Genitourinary: Negative.   Musculoskeletal: Negative.      Physical Exam Updated Vital Signs BP Marland Kitchen(!)  141/79    Pulse 63    Temp 98.1 F (36.7 C) (Oral)    Resp 15    Ht 5\' 11"  (1.803 m)    Wt 104.3 kg    SpO2 99%    BMI 32.08 kg/m   Physical Exam Vitals signs and nursing note reviewed.  Constitutional:      Appearance: He is well-developed.  HENT:     Head: Normocephalic.  Neck:     Musculoskeletal: Normal range of motion and neck supple.  Cardiovascular:     Rate and Rhythm: Normal rate and regular rhythm.     Heart sounds: No murmur.  Pulmonary:     Effort: Pulmonary  effort is normal.     Breath sounds: Normal breath sounds. No wheezing, rhonchi or rales.  Abdominal:     General: Bowel sounds are normal. There is no distension.     Palpations: Abdomen is soft.     Tenderness: There is abdominal tenderness in the epigastric area. There is no guarding or rebound.  Musculoskeletal: Normal range of motion.  Skin:    General: Skin is warm and dry.     Findings: No rash.  Neurological:     Mental Status: He is alert and oriented to person, place, and time.      ED Treatments / Results  Labs (all labs ordered are listed, but only abnormal results are displayed) Labs Reviewed  BASIC METABOLIC PANEL    EKG None  Radiology No results found.  Procedures Procedures (including critical care time)  Medications Ordered in ED Medications  sodium chloride 0.9 % bolus 1,000 mL (has no administration in time range)  metoCLOPramide (REGLAN) injection 10 mg (10 mg Intravenous Given 07/24/19 0046)     Initial Impression / Assessment and Plan / ED Course  I have reviewed the triage vital signs and the nursing notes.  Pertinent labs & imaging results that were available during my care of the patient were reviewed by me and considered in my medical decision making (see chart for details).        Patient to ED with 3 days of diarrhea, epigastric abdominal pain and nausea since eating what he feels was contaminated food. No fever. No bloody stools.  No vomiting or diarrhea movements in ED. He is given IVF's and Reglan with improvement. He is eating and drinking without worsening symptoms of pain, vomiting, or further diarrhea. He is felt appropriate for discharge home.   Final Clinical Impressions(s) / ED Diagnoses   Final diagnoses:  None   1. Diarrhea 2. Mild gastritis   ED Discharge Orders    None       Charlann Lange, PA-C 07/24/19 0234    Fatima Blank, MD 07/24/19 [phone]

## 2019-08-02 ENCOUNTER — Other Ambulatory Visit: Payer: Self-pay

## 2019-08-02 ENCOUNTER — Emergency Department (HOSPITAL_COMMUNITY)
Admission: EM | Admit: 2019-08-02 | Discharge: 2019-08-03 | Disposition: A | Discharge status: Home / Self Care | Payer: Medicaid Other | Attending: Emergency Medicine | Admitting: Emergency Medicine

## 2019-08-02 ENCOUNTER — Encounter (HOSPITAL_COMMUNITY): Payer: Self-pay | Admitting: Emergency Medicine

## 2019-08-02 DIAGNOSIS — F209 Schizophrenia, unspecified: Secondary | ICD-10-CM | POA: Insufficient documentation

## 2019-08-02 DIAGNOSIS — R197 Diarrhea, unspecified: Secondary | ICD-10-CM | POA: Insufficient documentation

## 2019-08-02 DIAGNOSIS — Z79899 Other long term (current) drug therapy: Secondary | ICD-10-CM | POA: Insufficient documentation

## 2019-08-02 DIAGNOSIS — R110 Nausea: Secondary | ICD-10-CM | POA: Insufficient documentation

## 2019-08-02 DIAGNOSIS — I10 Essential (primary) hypertension: Secondary | ICD-10-CM | POA: Insufficient documentation

## 2019-08-02 LAB — CBC
HCT: 46.1 % (ref 39.0–52.0)
Hemoglobin: 15.9 g/dL (ref 13.0–17.0)
MCH: 29.7 pg (ref 26.0–34.0)
MCHC: 34.5 g/dL (ref 30.0–36.0)
MCV: 86 fL (ref 80.0–100.0)
Platelets: 268 10*3/uL (ref 150–400)
RBC: 5.36 MIL/uL (ref 4.22–5.81)
RDW: 12.7 % (ref 11.5–15.5)
WBC: 8.7 10*3/uL (ref 4.0–10.5)
nRBC: 0 % (ref 0.0–0.2)

## 2019-08-02 LAB — COMPREHENSIVE METABOLIC PANEL
ALT: 87 U/L — ABNORMAL HIGH (ref 0–44)
AST: 44 U/L — ABNORMAL HIGH (ref 15–41)
Albumin: 4.6 g/dL (ref 3.5–5.0)
Alkaline Phosphatase: 65 U/L (ref 38–126)
Anion gap: 9 (ref 5–15)
BUN: 13 mg/dL (ref 6–20)
CO2: 25 mmol/L (ref 22–32)
Calcium: 9.6 mg/dL (ref 8.9–10.3)
Chloride: 108 mmol/L (ref 98–111)
Creatinine, Ser: 0.87 mg/dL (ref 0.61–1.24)
GFR calc Af Amer: >60 mL/min (ref >60)
GFR calc non Af Amer: >60 mL/min (ref >60)
Glucose, Bld: 80 mg/dL (ref 70–99)
Potassium: 3.8 mmol/L (ref 3.5–5.1)
Sodium: 142 mmol/L (ref 135–145)
Total Bilirubin: 1 mg/dL (ref 0.3–1.2)
Total Protein: 7.4 g/dL (ref 6.5–8.1)

## 2019-08-02 LAB — URINALYSIS, ROUTINE W REFLEX MICROSCOPIC
Bacteria, UA: NONE SEEN
Bilirubin Urine: NEGATIVE
Glucose, UA: NEGATIVE mg/dL
Hgb urine dipstick: NEGATIVE
Ketones, ur: NEGATIVE mg/dL
Leukocytes,Ua: NEGATIVE
Nitrite: NEGATIVE
Protein, ur: NEGATIVE mg/dL
Specific Gravity, Urine: 1.023 (ref 1.005–1.030)
pH: 6 (ref 5.0–8.0)

## 2019-08-02 LAB — LIPASE, BLOOD: Lipase: 39 U/L (ref 11–51)

## 2019-08-02 NOTE — ED Notes (Signed)
Pt provided labeled urine specimen cup for U/A collection when possible. ENMiles  

## 2019-08-02 NOTE — ED Provider Notes (Signed)
[facility]-EMERGENCY DEPT Provider Note   CSN: [id number] Arrival date & time: 08/02/19  2037    History   Chief Complaint Chief Complaint  Patient presents with  . Nausea  . Diarrhea    HPI Tom [name] is a 28 y.o. male presents today for nausea and diarrhea that began 2 days ago.  Patient reports eating some "bad chicken wings" on Sunday shortly after felt somewhat nauseous.  Patient reports few episodes of small amount of brown/yellow diarrhea.  He denies any blood in the stool, abdominal pain or vomiting reports that he is feeling well and has been able to tolerate small amounts of food without difficulty.  He denies any fever/chills, cough/chest pain, shortness of breath or any additional concerns today.  Patient reports he was seen here for similar symptoms 2 weeks ago at that time he was given Reglan, he is requesting more Reglan today as this worked well for him.     HPI  Past Medical History:  Diagnosis Date  . Anxiety     Patient Active Problem List   Diagnosis Date Noted  . Undifferentiated schizophrenia (HCC)   . Schizophrenia (HCC) 06/20/2016  . Tobacco use disorder 06/19/2016  . Essential hypertension 06/19/2016  . Obesity 06/19/2016    History reviewed. No pertinent surgical history.      Home Medications    Prior to Admission medications   Medication Sig Start Date End Date Taking? Authorizing Provider  ARIPiprazole (ABILIFY) 20 MG tablet Take 1 tablet (20 mg total) by mouth daily. Patient not taking: Reported on 06/01/2019 06/23/16   Jimmy FootmanHernandez-Gonzalez, Andrea, MD  ARIPiprazole ER 400 MG SUSR Inject 400 mg into the muscle every 30 (thirty) days. Patient not taking: Reported on 06/01/2019 06/23/16   Jimmy FootmanHernandez-Gonzalez, Andrea, MD  colchicine 0.6 MG tablet Take 1 tablet (0.6 mg total) by mouth 2 (two) times daily. Patient not taking: Reported on 08/02/2019 06/01/19   Emi HolesLaw, Alexandra M, PA-C  diphenhydrAMINE (BENADRYL) 50 MG  capsule Take 1 capsule (50 mg total) by mouth at bedtime. Patient not taking: Reported on 06/01/2019 06/23/16   Jimmy FootmanHernandez-Gonzalez, Andrea, MD  famotidine (PEPCID) 20 MG tablet Take 1 tablet (20 mg total) by mouth daily. OTC Patient not taking: Reported on 08/02/2019 07/17/19   Arthor CaptainHarris, Abigail, PA-C  hydrochlorothiazide (MICROZIDE) 12.5 MG capsule Take 1 capsule (12.5 mg total) by mouth daily. Patient not taking: Reported on 06/01/2019 06/23/16   Jimmy FootmanHernandez-Gonzalez, Andrea, MD  metoCLOPramide (REGLAN) 5 MG tablet Take 1 tablet (5 mg total) by mouth every 8 (eight) hours as needed for nausea or vomiting. 08/03/19   Bill SalinasMorelli, Zali Kamaka A, PA-C    Family History No family history on file.  Social History Social History   Tobacco Use  . Smoking status: Never Smoker  . Smokeless tobacco: Never Used  Substance Use Topics  . Alcohol use: No  . Drug use: No     Allergies   Penicillins   Review of Systems Review of Systems Ten systems are reviewed and are negative for acute change except as noted in the HPI  Physical Exam Updated Vital Signs BP 125/71   Pulse (!) 58   Temp 98.3 F (36.8 C) (Oral)   Resp 19   SpO2 100%   Physical Exam Constitutional:      General: He is not in acute distress.    Appearance: Normal appearance. He is well-developed. He is obese. He is not ill-appearing or diaphoretic.  HENT:     Head:  Normocephalic and atraumatic.     Right Ear: External ear normal.     Left Ear: External ear normal.     Nose: Nose normal.  Eyes:     General: Vision grossly intact. Gaze aligned appropriately.     Pupils: Pupils are equal, round, and reactive to light.  Neck:     Musculoskeletal: Normal range of motion.     Trachea: Trachea and phonation normal. No tracheal deviation.  Cardiovascular:     Rate and Rhythm: Normal rate and regular rhythm.     Heart sounds: Normal heart sounds.  Pulmonary:     Effort: Pulmonary effort is normal. No respiratory distress.  Abdominal:      General: There is no distension.     Palpations: Abdomen is soft.     Tenderness: There is no abdominal tenderness. There is no guarding or rebound. Negative signs include Murphy's sign, Rovsing's sign and McBurney's sign.  Musculoskeletal: Normal range of motion.  Skin:    General: Skin is warm and dry.  Neurological:     Mental Status: He is alert.     GCS: GCS eye subscore is 4. GCS verbal subscore is 5. GCS motor subscore is 6.     Comments: Speech is clear and goal oriented, follows commands Major Cranial nerves without deficit, no facial droop Moves extremities without ataxia, coordination intact  Psychiatric:        Behavior: Behavior normal.    ED Treatments / Results  Labs (all labs ordered are listed, but only abnormal results are displayed) Labs Reviewed  COMPREHENSIVE METABOLIC PANEL - Abnormal; Notable for the following components:      Result Value   AST 44 (*)    ALT 87 (*)    All other components within normal limits  C DIFFICILE QUICK SCREEN W PCR REFLEX  GASTROINTESTINAL PANEL BY PCR, STOOL (REPLACES STOOL CULTURE)  LIPASE, BLOOD  CBC  URINALYSIS, ROUTINE W REFLEX MICROSCOPIC    EKG None  Radiology No results found.  Procedures Procedures (including critical care time)  Medications Ordered in ED Medications  metoCLOPramide (REGLAN) injection 5 mg (5 mg Intravenous Given 08/03/19 0120)     Initial Impression / Assessment and Plan / ED Course  I have reviewed the triage vital signs and the nursing notes.  Pertinent labs & imaging results that were available during my care of the patient were reviewed by me and considered in my medical decision making (see chart for details).    CBC within normal limits Lipase within normal limits LFTs with mildly elevated AST/ALT, improved from prior Urinalysis within normal limits Afebrile, not tachycardic, normotensive, SPO2 without hypoxia on room air. - Examination reveals a soft nontender abdomen  without distention or peritoneal signs. - C. difficile PCR negative GI stool panel in process - Patient given 5 mg Reglan reports improvement of his symptoms.  Tolerating p.o. here in the emergency department requesting discharge.  Repeat vital signs stable.  On reexamination the abdomen, soft nontender without distention or peritoneal signs.  Based on work-up examination today I have low suspicion for appendicitis, SBO, diverticulitis, cholecystitis or other acute intra-abdominopelvic pathologies at this time.  Will discharge patient with 5 mg Reglan every 8 as needed and PCP/gastroenterology follow-up as needed.  At this time there does not appear to be any evidence of an acute emergency medical condition and the patient appears stable for discharge with appropriate outpatient follow up. Diagnosis was discussed with patient who verbalizes understanding of care plan  and is agreeable to discharge. I have discussed return precautions with patient who verbalizes understanding of return precautions. Patient encouraged to follow-up with their PCP. All questions answered.  Patient has been discharged in good condition.  Patient's case discussed with Dr. Leonette Monarch who agrees with plan to discharge with follow-up.   Note: Portions of this report may have been transcribed using voice recognition software. Every effort was made to ensure accuracy; however, inadvertent computerized transcription errors may still be present. Final Clinical Impressions(s) / ED Diagnoses   Final diagnoses:  Nausea  Diarrhea, unspecified type    ED Discharge Orders         Ordered    metoCLOPramide (REGLAN) 5 MG tablet  Every 8 hours PRN     08/03/19 0225           Deliah Boston, PA-C 08/03/19 0228    Fatima Blank, MD 08/05/19 [phone]

## 2019-08-02 NOTE — ED Triage Notes (Signed)
Patient c/o nausea and diarrhea with "upset stomach" x3 days. Denies fever, cough, SOB.

## 2019-08-03 LAB — GASTROINTESTINAL PANEL BY PCR, STOOL (REPLACES STOOL CULTURE)

## 2019-08-03 LAB — C DIFFICILE QUICK SCREEN W PCR REFLEX
C Diff antigen: NEGATIVE
C Diff interpretation: NOT DETECTED
C Diff toxin: NEGATIVE

## 2019-08-03 MED ORDER — METOCLOPRAMIDE HCL 5 MG PO TABS: 5.0000 mg | ORAL_TABLET | Freq: Three times a day (TID) | ORAL | 0 refills | Status: AC | PRN

## 2019-08-03 MED ORDER — METOCLOPRAMIDE HCL 5 MG/ML IJ SOLN
5.0000 mg | Freq: Once | INTRAMUSCULAR | Status: AC
  Administered 2019-08-03: 5 mg via INTRAVENOUS
  Filled 2019-08-03: qty 2

## 2019-08-03 NOTE — Discharge Instructions (Addendum)
You have been diagnosed today with Nausea and Diarrhea.  At this time there does not appear to be the presence of an emergent medical condition, however there is always the potential for conditions to change. Please read and follow the below instructions.  Please return to the Emergency Department immediately for any new or worsening symptoms. Please be sure to follow up with your Primary Care Provider within one week regarding your visit today; please call their office to schedule an appointment even if you are feeling better for a follow-up visit. You may call the specialist at [facility] gastroenterology to establish a follow-up visit. You may use the nausea medication Reglan as prescribed for severe nausea and vomiting. Please be sure to drink plenty of water to avoid dehydration, get plenty of rest.  Avoid eating undercooked food as this may cause nausea and diarrhea.  Get help right away if: You have chest pain. You feel very weak or you pass out (faint). You have bloody or black poop or poop that looks like tar. You have pain, cramping, or bloating in your belly (abdomen). You have trouble breathing or you are breathing very quickly. Your heart is beating very quickly. Your skin feels cold and clammy. You feel confused. You have signs of losing too much water in your body, such as: Dark pee, very little pee, or no pee. Cracked lips. Dry mouth. Sunken eyes. Sleepiness. Weakness. You have any new/concerning or worsening symptoms  Please read the additional information packets attached to your discharge summary.  Do not take your medicine if  develop an itchy rash, swelling in your mouth or lips, or difficulty breathing; call 911 and seek immediate emergency medical attention if this occurs.

## 2019-08-03 NOTE — ED Notes (Signed)
Pt given a sprite 

## 2020-06-05 IMAGING — CT CT ABDOMEN AND PELVIS WITH CONTRAST
2 of 4 series · 17 of 46 positions shown, 19 images · IV contrast (APPLIED)
Comparison: None.

CLINICAL DATA: Epigastric pain. Abnormal LFT and lipase. Rule out
cholecystitis

EXAM:
CT ABDOMEN AND PELVIS WITH CONTRAST
TECHNIQUE: Multidetector CT imaging of the abdomen and pelvis was performed
using the standard protocol following bolus administration of
intravenous contrast.
CONTRAST:  100mL OMNIPAQUE IOHEXOL 350 MG/ML SOLN

[Series 3: abdomen 5.0 · axial · 0.85mm/px · z∈[+892,+1352]mm · 14 of 106 slices shown, 16 images]
[im 7/106  soft-tissue]
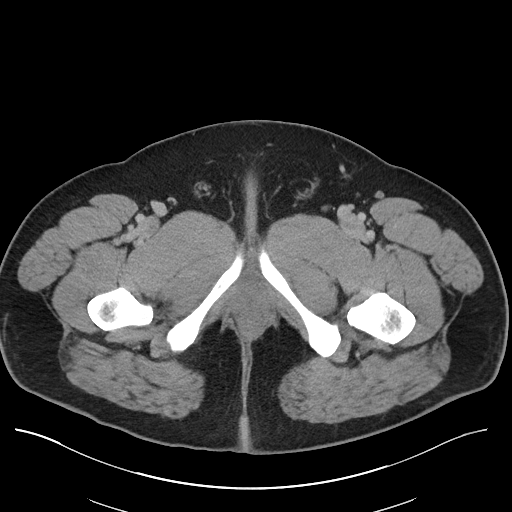
[im 7/106  bone]
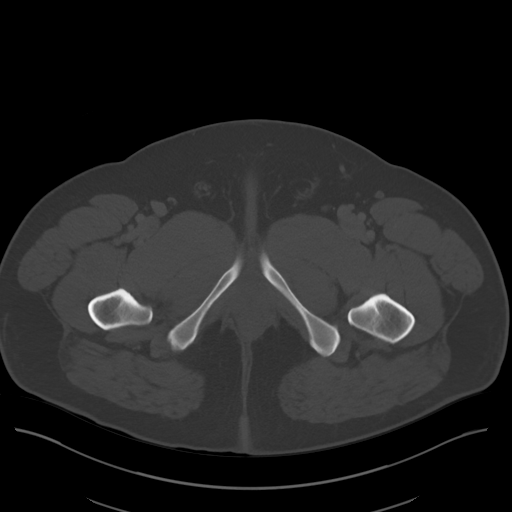
[im 13/106  soft-tissue]
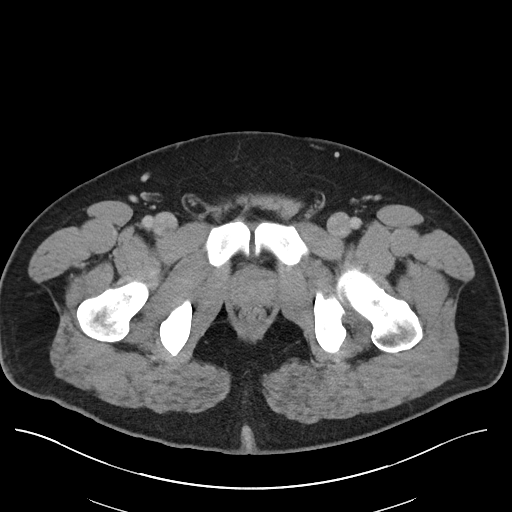
[im 19/106  soft-tissue]
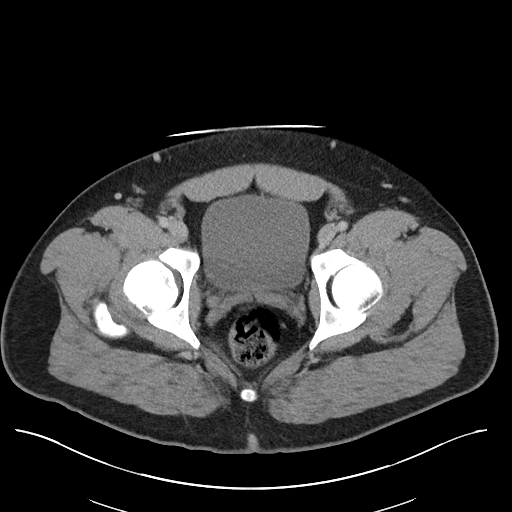
[im 31/106  soft-tissue]
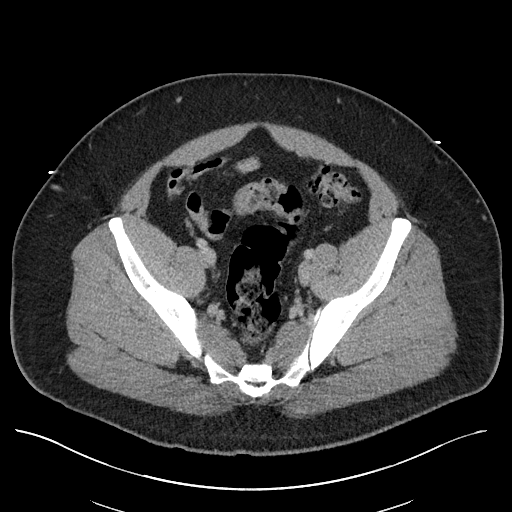
[im 38/106  soft-tissue]
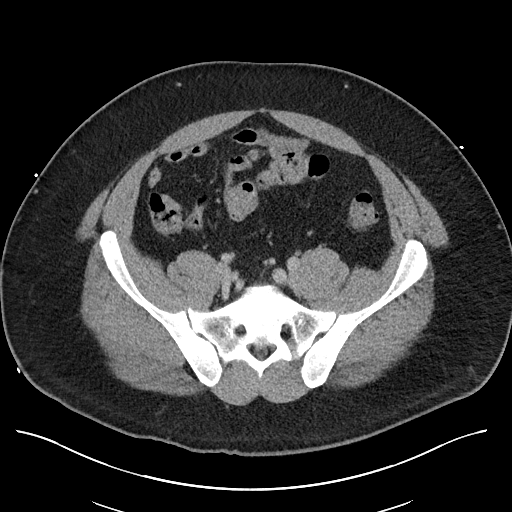
[im 44/106  soft-tissue]
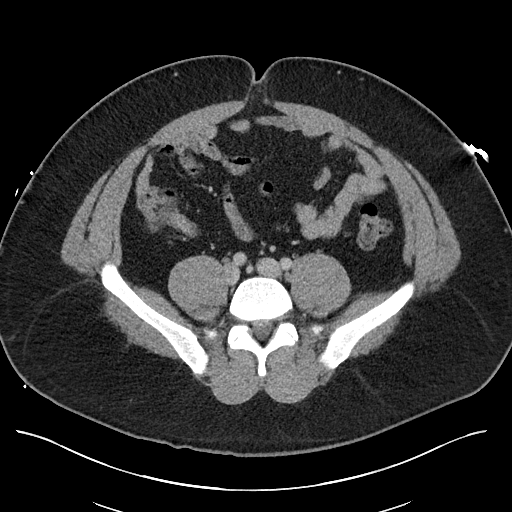
[im 50/106  soft-tissue]
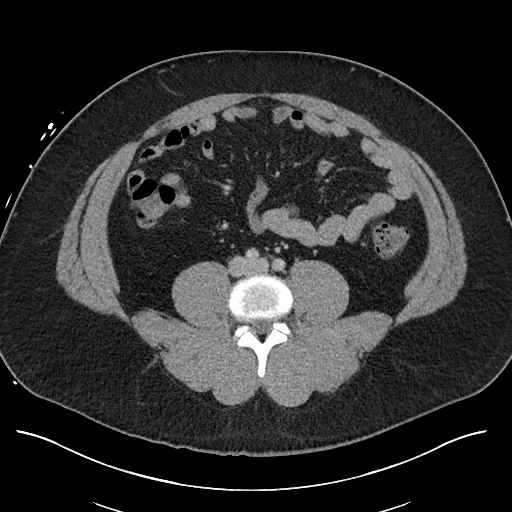
[im 56/106  soft-tissue]
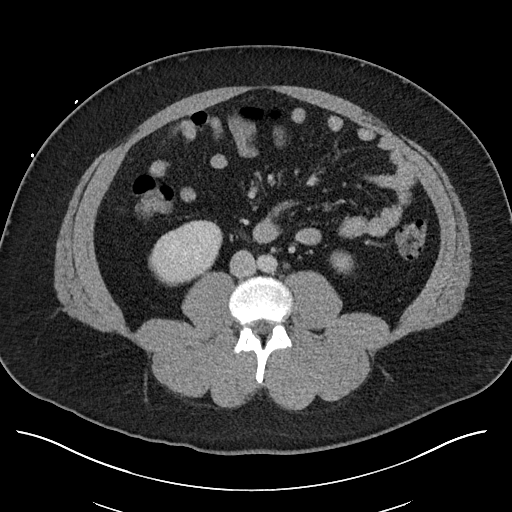
[im 62/106  soft-tissue]
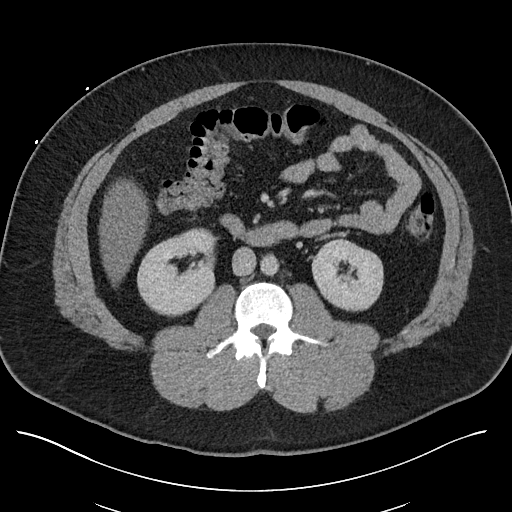
[im 62/106  bone]
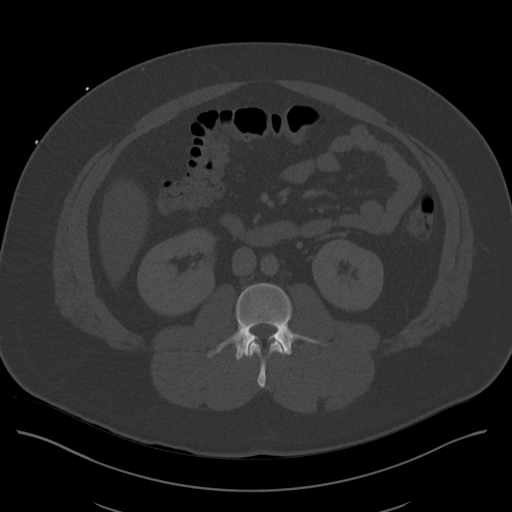
[im 68/106  soft-tissue]
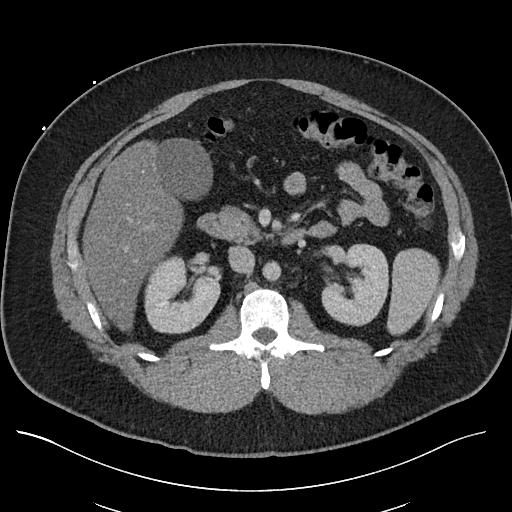
[im 81/106  soft-tissue]
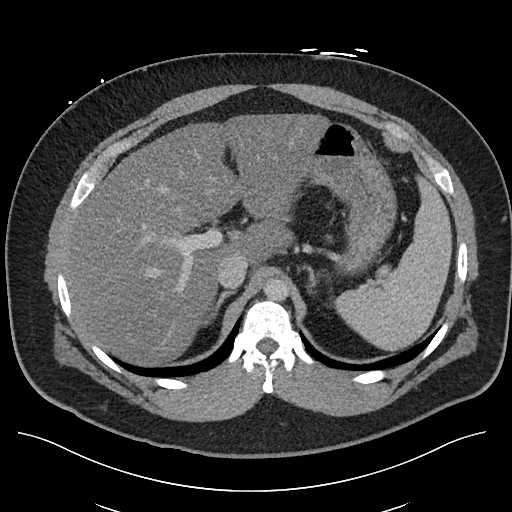
[im 87/106  soft-tissue]
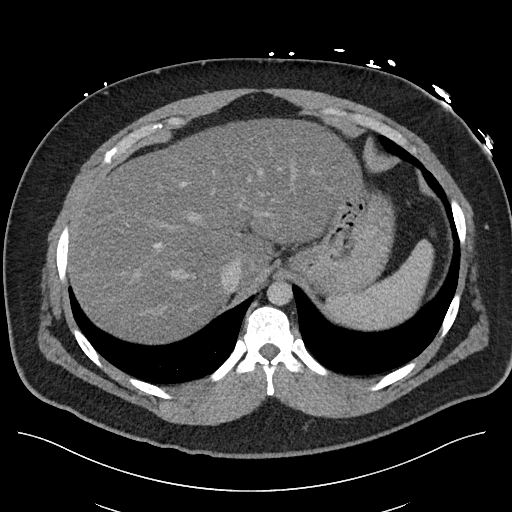
[im 93/106  soft-tissue]
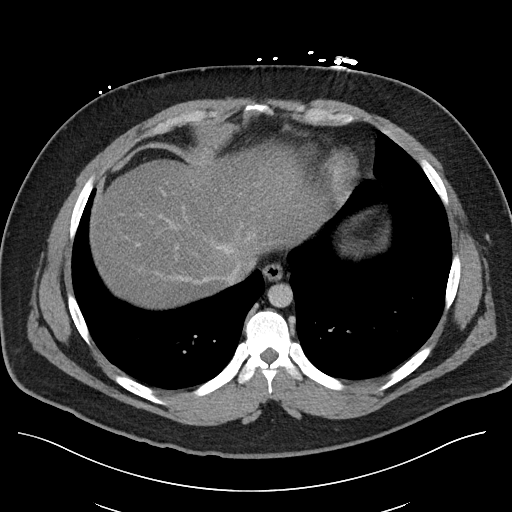
[im 99/106  soft-tissue]
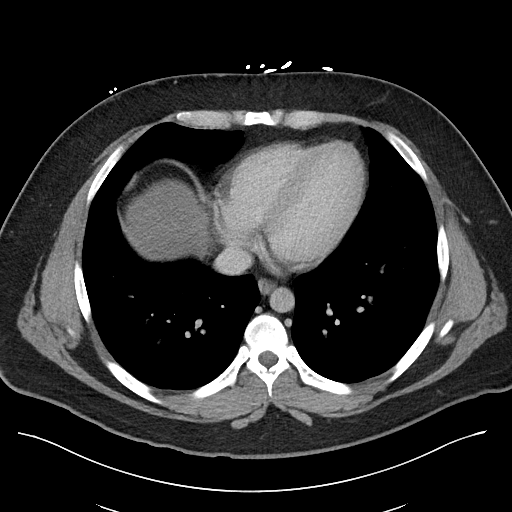

[Series 6: abdomen 3.0 mpr cor · coronal · 0.96mm/px · 3 of 107 slices shown]
[im 36/107  soft-tissue]
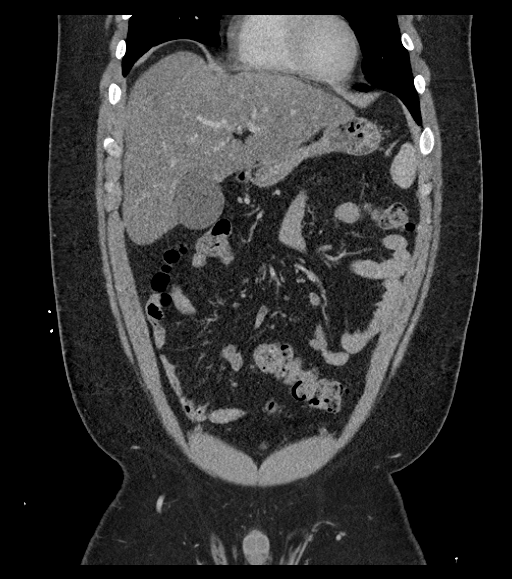
[im 48/107  soft-tissue]
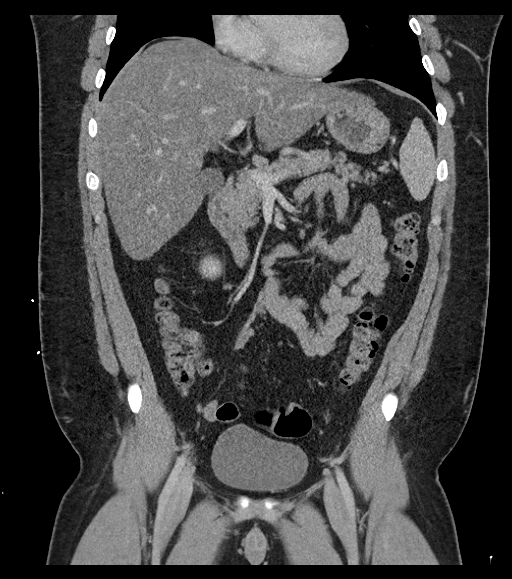
[im 59/107  soft-tissue]
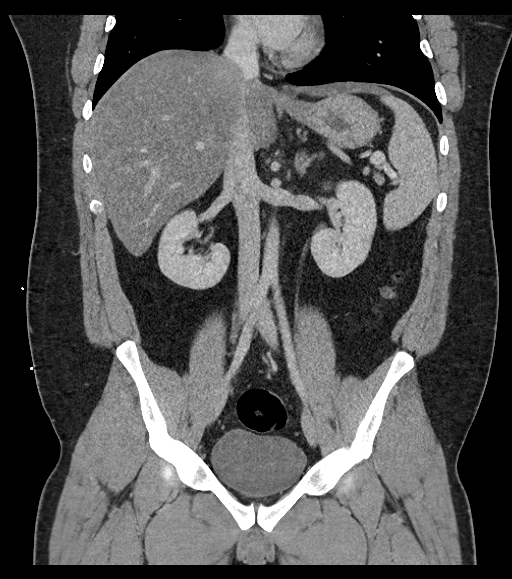

[17 of 46 positions shown; findings below may reference images not displayed]

FINDINGS: Lower chest: Lung bases clear bilaterally.  Heart size normal.

Hepatobiliary: Diffuse fatty infiltration of the liver without focal
liver lesion. Gallbladder and bile ducts normal.

Pancreas: Negative

Spleen: Negative

Adrenals/Urinary Tract: Adrenal glands are unremarkable. Kidneys are
normal, without renal calculi, focal lesion, or hydronephrosis.
Bladder is unremarkable.

Stomach/Bowel: Stomach is within normal limits. Appendix appears
normal. No evidence of bowel wall thickening, distention, or
inflammatory changes.

Vascular/Lymphatic: No significant vascular findings are present. No
enlarged abdominal or pelvic lymph nodes.

Reproductive: Normal prostate

Other: Small inguinal hernia bilaterally containing fat

Musculoskeletal: Negative
IMPRESSION: Fatty infiltration liver. No evidence of cholecystitis or
pancreatitis.

Normal appendix.
